# Patient Record
Sex: Male | Born: 1990 | Race: Black or African American | Hispanic: No | Marital: Single | Smoking: Former smoker
Health system: Southern US, Community
[De-identification: ages and names within clinical notes are randomized; demographics above are authoritative.]

## PROBLEM LIST (undated history)

## (undated) DIAGNOSIS — F419 Anxiety disorder, unspecified: Secondary | ICD-10-CM

## (undated) DIAGNOSIS — K219 Gastro-esophageal reflux disease without esophagitis: Secondary | ICD-10-CM

## (undated) DIAGNOSIS — J45909 Unspecified asthma, uncomplicated: Secondary | ICD-10-CM

## (undated) HISTORY — PX: TONSILLECTOMY: SUR1361

---

## 2014-02-09 ENCOUNTER — Emergency Department (HOSPITAL_COMMUNITY)
Admission: EM | Admit: 2014-02-09 | Discharge: 2014-02-10 | Disposition: A | Discharge status: Home / Self Care | Payer: No Typology Code available for payment source | Attending: Emergency Medicine | Admitting: Emergency Medicine

## 2014-02-09 ENCOUNTER — Encounter (HOSPITAL_COMMUNITY): Payer: Self-pay | Admitting: Emergency Medicine

## 2014-02-09 DIAGNOSIS — R002 Palpitations: Secondary | ICD-10-CM | POA: Insufficient documentation

## 2014-02-09 DIAGNOSIS — F17200 Nicotine dependence, unspecified, uncomplicated: Secondary | ICD-10-CM | POA: Insufficient documentation

## 2014-02-09 DIAGNOSIS — Z79899 Other long term (current) drug therapy: Secondary | ICD-10-CM | POA: Insufficient documentation

## 2014-02-09 DIAGNOSIS — J45909 Unspecified asthma, uncomplicated: Secondary | ICD-10-CM | POA: Insufficient documentation

## 2014-02-09 HISTORY — DX: Unspecified asthma, uncomplicated: J45.909

## 2014-02-09 LAB — CBC
HEMATOCRIT: 45.7 % (ref 39.0–52.0)
Hemoglobin: 15.9 g/dL (ref 13.0–17.0)
MCH: 32.4 pg (ref 26.0–34.0)
MCHC: 34.8 g/dL (ref 30.0–36.0)
MCV: 93.3 fL (ref 78.0–100.0)
Platelets: 242 10*3/uL (ref 150–400)
RBC: 4.9 MIL/uL (ref 4.22–5.81)
RDW: 11.7 % (ref 11.5–15.5)
WBC: 5.8 10*3/uL (ref 4.0–10.5)

## 2014-02-09 LAB — BASIC METABOLIC PANEL
BUN: 11 mg/dL (ref 6–23)
CALCIUM: 9.9 mg/dL (ref 8.4–10.5)
CO2: 21 mEq/L (ref 19–32)
Chloride: 98 mEq/L (ref 96–112)
Creatinine, Ser: 0.94 mg/dL (ref 0.50–1.35)
GFR calc Af Amer: >90 mL/min (ref >90)
GLUCOSE: 126 mg/dL — AB (ref 70–99)
Potassium: 3.5 mEq/L — ABNORMAL LOW (ref 3.7–5.3)
Sodium: 136 mEq/L — ABNORMAL LOW (ref 137–147)

## 2014-02-09 LAB — I-STAT TROPONIN, ED: Troponin i, poc: 0.01 ng/mL (ref 0.00–0.08)

## 2014-02-09 LAB — PRO B NATRIURETIC PEPTIDE: Pro B Natriuretic peptide (BNP): 11.6 pg/mL (ref 0–125)

## 2014-02-09 NOTE — ED Notes (Signed)
Pt states he has had palpitations in the past but he has been having them since yesterday and has felt weak and sob. Was seen by EMS yesterday but his vitals were WNL so he waited to come in. Alert and oriented.

## 2014-02-09 NOTE — ED Notes (Signed)
Pt states he has a warm sensation in his arms, chest, and legs.

## 2014-02-10 MED ORDER — POTASSIUM CHLORIDE CRYS ER 20 MEQ PO TBCR
40.0000 meq | EXTENDED_RELEASE_TABLET | Freq: Once | ORAL | Status: AC
  Administered 2014-02-10: 40 meq via ORAL
  Filled 2014-02-10: qty 2

## 2014-02-10 NOTE — ED Provider Notes (Signed)
CSN: [id number]     Arrival date & time 02/09/14  2219 History   First MD Initiated Contact with Patient 02/10/14 [phone]     Chief Complaint  Patient presents with  . Palpitations     (Consider location/radiation/quality/duration/timing/severity/associated sxs/prior Treatment) HPI HX per PT - weakness all over and palpitations, with warm sensation in his chest. Onset 2 days ago, had associated lip tingling, called EMS and was evaluated and declined transport for improving symptoms. At home tonight laying down developed same symptoms and presents here for evaluation.  No N/V/D. No F/C. Tried using his inhaler and also took some benadryl. Feels better now in the ER. No drug use. No medications, takes vitamins OTC. No syncope, no ABD pain. No increased exercise or dehydration. Drinks caffeine, no h/o thyroid disease.   Past Medical History  Diagnosis Date  . Asthma    Past Surgical History  Procedure Laterality Date  . Tonsillectomy     No family history on file. History  Substance Use Topics  . Smoking status: Current Some Day Smoker  . Smokeless tobacco: Not on file  . Alcohol Use: 3.6 oz/week    6 Cans of beer per week    Review of Systems  Constitutional: Negative for fever and chills.  Eyes: Negative for visual disturbance.  Respiratory: Negative for shortness of breath.   Cardiovascular: Positive for palpitations.  Gastrointestinal: Negative for vomiting and abdominal pain.  Genitourinary: Negative for dysuria.  Musculoskeletal: Negative for back pain and neck pain.  Skin: Negative for rash.  Neurological: Negative for headaches.  All other systems reviewed and are negative.      Allergies  Review of patient's allergies indicates no known allergies.  Home Medications   Current Outpatient Rx  Name  Route  Sig  Dispense  Refill  . albuterol (PROVENTIL HFA;VENTOLIN HFA) 108 (90 BASE) MCG/ACT inhaler   Inhalation   Inhale 1-2 puffs into the lungs every 6 (six)  hours as needed for wheezing or shortness of breath (wheezing).         . vitamin B-12 (CYANOCOBALAMIN) 100 MCG tablet   Oral   Take 100 mcg by mouth daily.         . vitamin E 400 UNIT capsule   Oral   Take 400 Units by mouth daily.          BP 135/78  Pulse 102  Temp(Src) 98.3 F (36.8 C) (Oral)  Resp 16  SpO2 99% Physical Exam  Constitutional: He is oriented to person, place, and time. He appears well-developed and well-nourished.  HENT:  Head: Normocephalic and atraumatic.  Mouth/Throat: No oropharyngeal exudate.  Mildly dry mucous membranes  Eyes: EOM are normal. Pupils are equal, round, and reactive to light.  Neck: Neck supple. No thyromegaly present.  Cardiovascular: Normal rate, regular rhythm and intact distal pulses.   Pulmonary/Chest: Effort normal and breath sounds normal. No respiratory distress. He exhibits no tenderness.  Abdominal: Soft. Bowel sounds are normal. He exhibits no distension. There is no tenderness. There is no rebound.  Musculoskeletal: Normal range of motion. He exhibits no edema.  Neurological: He is alert and oriented to person, place, and time.  Skin: Skin is warm and dry.    ED Course  Procedures (including critical care time) Labs Review Labs Reviewed  BASIC METABOLIC PANEL - Abnormal; Notable for the following:    Sodium 136 (*)    Potassium 3.5 (*)    Glucose, Bld 126 (*)    All  other components within normal limits  CBC  PRO B NATRIURETIC PEPTIDE  I-STAT TROPOININ, ED   Imaging Review No results found.   EKG Interpretation   Date/Time:  Monday February 09 2014 22:47:58 EDT Ventricular Rate:  104 PR Interval:  175 QRS Duration: 85 QT Interval:  316 QTC Calculation: 416 R Axis:   66 Text Interpretation:  Sinus tachycardia Nonspecific ST abnormality No old  tracing to compare Confirmed by Katielynn Horan  MD, Joah Patlan ([id number]) on 02/10/2014  1:44:51 AM     PO fluids and potassium provided.  2:52 AM patient is feeling much  better his heart rate is normalized.   Plan discharge home followup with primary care physician, have thyroid checked, and consider Holter monitoring. Patient agrees to strict return precautions. He will try to increase potassium in his diet.    MDM   Diagnosis: Palpitations  Has had 2 recurrent episodes of palpitations with perioral tingling. No history of panic attacks otherwise. Symptoms did occur in conjunction with albuterol use, although has been using albuterol for years. Labs obtained and reviewed as above. Potassium provided. EKG reviewed sinus tachycardia. Patient does appear mildly dehydrated on exam and oral fluids provided. No indication for admit for further workup in the ER at this time. Vital signs nursing notes reviewed and considered   Sunnie Nielsen, MD 02/10/14 [phone]

## 2014-02-10 NOTE — Discharge Instructions (Signed)
Palpitations   A palpitation is the feeling that your heartbeat is irregular or is faster than normal. It may feel like your heart is fluttering or skipping a beat. Palpitations are usually not a serious problem. However, in some cases, you may need further medical evaluation.  CAUSES   Palpitations can be caused by:   Smoking.   Caffeine or other stimulants, such as diet pills or energy drinks.   Alcohol.   Stress and anxiety.   Strenuous physical activity.   Fatigue.   Certain medicines.   Heart disease, especially if you have a history of arrhythmias. This includes atrial fibrillation, atrial flutter, or supraventricular tachycardia.   An improperly working pacemaker or defibrillator.  DIAGNOSIS   To find the cause of your palpitations, your caregiver will take your history and perform a physical exam. Tests may also be done, including:   Electrocardiography (ECG). This test records the heart's electrical activity.   Cardiac monitoring. This allows your caregiver to monitor your heart rate and rhythm in real time.   Holter monitor. This is a portable device that records your heartbeat and can help diagnose heart arrhythmias. It allows your caregiver to track your heart activity for several days, if needed.   Stress tests by exercise or by giving medicine that makes the heart beat faster.  TREATMENT   Treatment of palpitations depends on the cause of your symptoms and can vary greatly. Most cases of palpitations do not require any treatment other than time, relaxation, and monitoring your symptoms. Other causes, such as atrial fibrillation, atrial flutter, or supraventricular tachycardia, usually require further treatment.  HOME CARE INSTRUCTIONS    Avoid:   Caffeinated coffee, tea, soft drinks, diet pills, and energy drinks.   Chocolate.   Alcohol.   Stop smoking if you smoke.   Reduce your stress and anxiety. Things that can help you relax include:   A method that measures bodily functions so  you can learn to control them (biofeedback).   Yoga.   Meditation.   Physical activity such as swimming, jogging, or walking.   Get plenty of rest and sleep.  SEEK MEDICAL CARE IF:    You continue to have a fast or irregular heartbeat beyond 24 hours.   Your palpitations occur more often.  SEEK IMMEDIATE MEDICAL CARE IF:   You develop chest pain or shortness of breath.   You have a severe headache.   You feel dizzy, or you faint.  MAKE SURE YOU:   Understand these instructions.   Will watch your condition.   Will get help right away if you are not doing well or get worse.  Document Released: 10/20/2000 Document Revised: 02/17/2013 Document Reviewed: 12/22/2011  [employer] Patient Information 2014 [employer].

## 2014-02-13 ENCOUNTER — Encounter (HOSPITAL_COMMUNITY): Payer: Self-pay | Admitting: Emergency Medicine

## 2014-02-13 ENCOUNTER — Emergency Department (HOSPITAL_COMMUNITY)
Admission: EM | Admit: 2014-02-13 | Discharge: 2014-02-13 | Disposition: A | Discharge status: Home / Self Care | Payer: No Typology Code available for payment source | Attending: Emergency Medicine | Admitting: Emergency Medicine

## 2014-02-13 DIAGNOSIS — F17200 Nicotine dependence, unspecified, uncomplicated: Secondary | ICD-10-CM | POA: Insufficient documentation

## 2014-02-13 DIAGNOSIS — J45909 Unspecified asthma, uncomplicated: Secondary | ICD-10-CM | POA: Insufficient documentation

## 2014-02-13 DIAGNOSIS — R531 Weakness: Secondary | ICD-10-CM

## 2014-02-13 DIAGNOSIS — B349 Viral infection, unspecified: Secondary | ICD-10-CM

## 2014-02-13 DIAGNOSIS — E876 Hypokalemia: Secondary | ICD-10-CM | POA: Insufficient documentation

## 2014-02-13 DIAGNOSIS — R5383 Other fatigue: Secondary | ICD-10-CM | POA: Insufficient documentation

## 2014-02-13 DIAGNOSIS — R5381 Other malaise: Secondary | ICD-10-CM | POA: Insufficient documentation

## 2014-02-13 DIAGNOSIS — Z79899 Other long term (current) drug therapy: Secondary | ICD-10-CM | POA: Insufficient documentation

## 2014-02-13 LAB — I-STAT CHEM 8, ED
BUN: 7 mg/dL (ref 6–23)
CHLORIDE: 100 meq/L (ref 96–112)
Calcium, Ion: 1.2 mmol/L (ref 1.12–1.23)
Creatinine, Ser: 1 mg/dL (ref 0.50–1.35)
GLUCOSE: 101 mg/dL — AB (ref 70–99)
HCT: 51 % (ref 39.0–52.0)
Hemoglobin: 17.3 g/dL — ABNORMAL HIGH (ref 13.0–17.0)
POTASSIUM: 3.8 meq/L (ref 3.7–5.3)
Sodium: 140 mEq/L (ref 137–147)
TCO2: 24 mmol/L (ref 0–100)

## 2014-02-13 NOTE — ED Notes (Signed)
Pt reports was seen on Monday for same sx, diagnosis of hypokalemia. Pt was given PO potassium while in ED. Pt states since Monday has been multi-vitamins with 80 mg Potassium. Pt c/o continued weakness, SOB, and shaking.

## 2014-02-13 NOTE — ED Provider Notes (Addendum)
CSN: [id number]     Arrival date & time 02/13/14  0153 History   First MD Initiated Contact with Patient 02/13/14 0210     Chief Complaint  Patient presents with  . hypokalemia      (Consider location/radiation/quality/duration/timing/severity/associated sxs/prior Treatment) HPI Comments: Pt comes in with cc of weakness. Pt has asthma hx. Started having some chest discomfort and dib on Sunday. Took breathing tx, with no improvement, so came to the ER. Workup was neg, and he was discharged. He reports that he still feels generalized weakness. Pt denies nausea, emesis, fevers, chills, chest pains, shortness of breath, headaches, abdominal pain, uti like symptoms at this time. No hx of PE, DVT for the patient, and no risk factors for the same. No substance abuse hx.    The history is provided by the patient.    Past Medical History  Diagnosis Date  . Asthma    Past Surgical History  Procedure Laterality Date  . Tonsillectomy     No family history on file. History  Substance Use Topics  . Smoking status: Current Some Day Smoker  . Smokeless tobacco: Not on file  . Alcohol Use: 3.6 oz/week    6 Cans of beer per week    Review of Systems  Constitutional: Positive for fatigue. Negative for chills, activity change and appetite change.  Respiratory: Negative for cough and shortness of breath.   Cardiovascular: Negative for chest pain.  Gastrointestinal: Negative for abdominal pain.  Genitourinary: Negative for dysuria.  Neurological: Positive for weakness.      Allergies  Review of patient's allergies indicates no known allergies.  Home Medications   Current Outpatient Rx  Name  Route  Sig  Dispense  Refill  . albuterol (PROVENTIL HFA;VENTOLIN HFA) 108 (90 BASE) MCG/ACT inhaler   Inhalation   Inhale 1-2 puffs into the lungs every 6 (six) hours as needed for wheezing or shortness of breath (wheezing).         . Multiple Vitamin (MULTIVITAMIN WITH MINERALS) TABS  tablet   Oral   Take 1-2 tablets by mouth daily.          . vitamin B-12 (CYANOCOBALAMIN) 100 MCG tablet   Oral   Take 100 mcg by mouth daily.         . vitamin E 400 UNIT capsule   Oral   Take 400 Units by mouth daily.          BP 136/75  Pulse 98  Temp(Src) 98.3 F (36.8 C) (Oral)  Resp 16  Ht 6\' 3"  (1.905 m)  Wt 267 lb (121.11 kg)  BMI 33.37 kg/m2  SpO2 97% Physical Exam  Nursing note and vitals reviewed. Constitutional: He is oriented to person, place, and time. He appears well-developed.  HENT:  Head: Normocephalic and atraumatic.  Eyes: Conjunctivae and EOM are normal. Pupils are equal, round, and reactive to light.  Neck: Normal range of motion. Neck supple.  Cardiovascular: Normal rate and regular rhythm.   Pulmonary/Chest: Effort normal and breath sounds normal.  Abdominal: Soft. Bowel sounds are normal. He exhibits no distension. There is no tenderness. There is no rebound and no guarding.  Neurological: He is alert and oriented to person, place, and time.  Skin: Skin is warm.    ED Course  Procedures (including critical care time) Labs Review Labs Reviewed  I-STAT CHEM 8, ED   Imaging Review No results found.   EKG Interpretation   Date/Time:  Friday February 13 2014 02:17:58 EDT  Ventricular Rate:  94 PR Interval:  175 QRS Duration: 84 QT Interval:  317 QTC Calculation: 396 R Axis:   66 Text Interpretation:  Sinus rhythm Probable left atrial enlargement ST  elev, probable normal early repol pattern Confirmed by Amisadai Woodford, MD, Janey Genta  ([id number]) on 02/13/2014 4:43:17 AM      MDM   Final diagnoses:  None    Pt comes in with cc of generalized weakness. His ROS is otherwise negative. Some dib, chest discomfort earlier in the weak. Pt has been hydrating well - and yes feels weak. No hx of thyroid disorder. His personal and family hx are also unremarkable.  i dont thin there is anything emergent. Will check chem 8, ekg. Suspect some  underlying mild viral infection with no overt manifestation or metabolic process.  Derwood Kaplan, MD 02/13/14 [id number]  Derwood Kaplan, MD 02/13/14 [phone]

## 2014-02-13 NOTE — Discharge Instructions (Signed)
Weakness  Weakness is a lack of strength. It may be felt all over the body (generalized) or in one specific part of the body (focal). Some causes of weakness can be serious. You may need further medical evaluation, especially if you are elderly or you have a history of immunosuppression (such as chemotherapy or HIV), kidney disease, heart disease, or diabetes.  CAUSES   Weakness can be caused by many different things, including:  · Infection.  · Physical exhaustion.  · Internal bleeding or other blood loss that results in a lack of red blood cells (anemia).  · Dehydration. This cause is more common in elderly people.  · Side effects or electrolyte abnormalities from medicines, such as pain medicines or sedatives.  · Emotional distress, anxiety, or depression.  · Circulation problems, especially severe peripheral arterial disease.  · Heart disease, such as rapid atrial fibrillation, bradycardia, or heart failure.  · Nervous system disorders, such as Guillain-Barré syndrome, multiple sclerosis, or stroke.  DIAGNOSIS   To find the cause of your weakness, your caregiver will take your history and perform a physical exam. Lab tests or X-rays may also be ordered, if needed.  TREATMENT   Treatment of weakness depends on the cause of your symptoms and can vary greatly.  HOME CARE INSTRUCTIONS   · Rest as needed.  · Eat a well-balanced diet.  · Try to get some exercise every day.  · Only take over-the-counter or prescription medicines as directed by your caregiver.  SEEK MEDICAL CARE IF:   · Your weakness seems to be getting worse or spreads to other parts of your body.  · You develop new aches or pains.  SEEK IMMEDIATE MEDICAL CARE IF:   · You cannot perform your normal daily activities, such as getting dressed and feeding yourself.  · You cannot walk up and down stairs, or you feel exhausted when you do so.  · You have shortness of breath or chest pain.  · You have difficulty moving parts of your body.  · You have weakness  in only one area of the body or on only one side of the body.  · You have a fever.  · You have trouble speaking or swallowing.  · You cannot control your bladder or bowel movements.  · You have black or bloody vomit or stools.  MAKE SURE YOU:  · Understand these instructions.  · Will watch your condition.  · Will get help right away if you are not doing well or get worse.  Document Released: 10/23/2005 Document Revised: 04/23/2012 Document Reviewed: 12/22/2011  [employer]® Patient Information ©2014 [employer].

## 2014-02-13 NOTE — ED Notes (Signed)
EKG given to EDP,Nanavati,MD. For review. 

## 2014-11-30 ENCOUNTER — Emergency Department (HOSPITAL_COMMUNITY): Payer: No Typology Code available for payment source

## 2014-11-30 ENCOUNTER — Encounter (HOSPITAL_COMMUNITY): Payer: Self-pay | Admitting: Emergency Medicine

## 2014-11-30 ENCOUNTER — Emergency Department (HOSPITAL_COMMUNITY)
Admission: EM | Admit: 2014-11-30 | Discharge: 2014-11-30 | Disposition: A | Discharge status: Home / Self Care | Payer: No Typology Code available for payment source | Attending: Emergency Medicine | Admitting: Emergency Medicine

## 2014-11-30 DIAGNOSIS — Z79899 Other long term (current) drug therapy: Secondary | ICD-10-CM | POA: Insufficient documentation

## 2014-11-30 DIAGNOSIS — W000XXA Fall on same level due to ice and snow, initial encounter: Secondary | ICD-10-CM | POA: Diagnosis not present

## 2014-11-30 DIAGNOSIS — Y9301 Activity, walking, marching and hiking: Secondary | ICD-10-CM | POA: Diagnosis not present

## 2014-11-30 DIAGNOSIS — S29092A Other injury of muscle and tendon of back wall of thorax, initial encounter: Secondary | ICD-10-CM | POA: Diagnosis not present

## 2014-11-30 DIAGNOSIS — Y998 Other external cause status: Secondary | ICD-10-CM | POA: Diagnosis not present

## 2014-11-30 DIAGNOSIS — Y9289 Other specified places as the place of occurrence of the external cause: Secondary | ICD-10-CM | POA: Diagnosis not present

## 2014-11-30 DIAGNOSIS — J45909 Unspecified asthma, uncomplicated: Secondary | ICD-10-CM | POA: Insufficient documentation

## 2014-11-30 DIAGNOSIS — M546 Pain in thoracic spine: Secondary | ICD-10-CM

## 2014-11-30 DIAGNOSIS — W19XXXA Unspecified fall, initial encounter: Secondary | ICD-10-CM

## 2014-11-30 DIAGNOSIS — Z720 Tobacco use: Secondary | ICD-10-CM | POA: Diagnosis not present

## 2014-11-30 MED ORDER — TRAMADOL HCL 50 MG PO TABS
50.0000 mg | ORAL_TABLET | Freq: Four times a day (QID) | ORAL | Status: DC | PRN
Start: 2014-11-30 — End: 2016-05-07

## 2014-11-30 NOTE — ED Notes (Addendum)
Pt had fall this morning on ice, c/o upper left back pain. No deformity noted.

## 2014-11-30 NOTE — Discharge Instructions (Signed)
Back Pain, Adult Low back pain is very common. About 1 in 5 people have back pain.The cause of low back pain is rarely dangerous. The pain often gets better over time.About half of people with a sudden onset of back pain feel better in just 2 weeks. About 8 in 10 people feel better by 6 weeks.  CAUSES Some common causes of back pain include:  Strain of the muscles or ligaments supporting the spine.  Wear and tear (degeneration) of the spinal discs.  Arthritis.  Direct injury to the back. DIAGNOSIS Most of the time, the direct cause of low back pain is not known.However, back pain can be treated effectively even when the exact cause of the pain is unknown.Answering your caregiver's questions about your overall health and symptoms is one of the most accurate ways to make sure the cause of your pain is not dangerous. If your caregiver needs more information, he or she may order lab work or imaging tests (X-rays or MRIs).However, even if imaging tests show changes in your back, this usually does not require surgery. HOME CARE INSTRUCTIONS For many people, back pain returns.Since low back pain is rarely dangerous, it is often a condition that people can learn to manageon their own.   Remain active. It is stressful on the back to sit or stand in one place. Do not sit, drive, or stand in one place for more than 30 minutes at a time. Take short walks on level surfaces as soon as pain allows.Try to increase the length of time you walk each day.  Do not stay in bed.Resting more than 1 or 2 days can delay your recovery.  Do not avoid exercise or work.Your body is made to move.It is not dangerous to be active, even though your back may hurt.Your back will likely heal faster if you return to being active before your pain is gone.  Pay attention to your body when you bend and lift. Many people have less discomfortwhen lifting if they bend their knees, keep the load close to their bodies,and  avoid twisting. Often, the most comfortable positions are those that put less stress on your recovering back.  Find a comfortable position to sleep. Use a firm mattress and lie on your side with your knees slightly bent. If you lie on your back, put a pillow under your knees.  Only take over-the-counter or prescription medicines as directed by your caregiver. Over-the-counter medicines to reduce pain and inflammation are often the most helpful.Your caregiver may prescribe muscle relaxant drugs.These medicines help dull your pain so you can more quickly return to your normal activities and healthy exercise.  Put ice on the injured area.  Put ice in a plastic bag.  Place a towel between your skin and the bag.  Leave the ice on for 15-20 minutes, 03-04 times a day for the first 2 to 3 days. After that, ice and heat may be alternated to reduce pain and spasms.  Ask your caregiver about trying back exercises and gentle massage. This may be of some benefit.  Avoid feeling anxious or stressed.Stress increases muscle tension and can worsen back pain.It is important to recognize when you are anxious or stressed and learn ways to manage it.Exercise is a great option. SEEK MEDICAL CARE IF:  You have pain that is not relieved with rest or medicine.  You have pain that does not improve in 1 week.  You have new symptoms.  You are generally not feeling well. SEEK   IMMEDIATE MEDICAL CARE IF:   You have pain that radiates from your back into your legs.  You develop new bowel or bladder control problems.  You have unusual weakness or numbness in your arms or legs.  You develop nausea or vomiting.  You develop abdominal pain.  You feel faint. Document Released: 10/23/2005 Document Revised: 04/23/2012 Document Reviewed: 02/24/2014 [employer] Patient Information 2015 [employer]. This information is not intended to replace advice given to you by your health care provider. Make sure you  discuss any questions you have with your health care provider.  

## 2014-11-30 NOTE — ED Provider Notes (Signed)
CSN: [id number]     Arrival date & time 11/30/14  0809 History   First MD Initiated Contact with Patient 11/30/14 [phone]     Chief Complaint  Patient presents with  . Back Pain  . Fall     (Consider location/radiation/quality/duration/timing/severity/associated sxs/prior Treatment) HPI  PCP: No PCP Per Patient Blood pressure 146/117, pulse 107, temperature 98.5 F (36.9 C), temperature source Oral, resp. rate 20, SpO2 96 %.  Gerald Cook is a 24 y.o.male without any significant PMH presents to the ER with complaints of mid back pain after two falls back to back on the ice. He was walking out to his car and lost his footing falling on his mid upper back. He attempted to stand up at which point he fell backwards again landing on his back. He reports hearing a crack. He has been up and ambulating since then. The incident happened just prior to arrival. He denies having loc or neck pain. He has not had bowel or urine incontinence. On his drive to work he decided to come to the ER instead due to the cracking noise during the fall and some of the discomfort that he has. NO pain at rest but pain with ROM.  Past Medical History  Diagnosis Date  . Asthma    Past Surgical History  Procedure Laterality Date  . Tonsillectomy     No family history on file. History  Substance Use Topics  . Smoking status: Current Some Day Smoker  . Smokeless tobacco: Not on file  . Alcohol Use: 3.6 oz/week    6 Cans of beer per week    Review of Systems  10 Systems reviewed and are negative for acute change except as noted in the HPI.    Allergies  Review of patient's allergies indicates no known allergies.  Home Medications   Prior to Admission medications   Medication Sig Start Date End Date Taking? Authorizing Provider  albuterol (PROVENTIL HFA;VENTOLIN HFA) 108 (90 BASE) MCG/ACT inhaler Inhale 1-2 puffs into the lungs every 6 (six) hours as needed for wheezing or shortness of breath.    Yes  Historical Provider, MD  loratadine (CLARITIN) 10 MG tablet Take 10 mg by mouth daily.   Yes Historical Provider, MD  Multiple Vitamin (MULTIVITAMIN WITH MINERALS) TABS tablet Take 1 tablet by mouth daily. If doesn't take tablet will drink smoothie   Yes Historical Provider, MD   BP 146/117 mmHg  Pulse 107  Temp(Src) 98.5 F (36.9 C) (Oral)  Resp 20  SpO2 96% Physical Exam  Constitutional: He appears well-developed and well-nourished. No distress.  HENT:  Head: Normocephalic and atraumatic.  Eyes: Pupils are equal, round, and reactive to light.  Neck: Normal range of motion. Neck supple.  Cardiovascular: Normal rate and regular rhythm.   Pulmonary/Chest: Effort normal.  Abdominal: Soft.  Musculoskeletal:  Pt has equal strength to bilateral lower extremities.  Neurosensory function adequate to both legs Skin color is normal. Skin is warm and moist.  I see no step off deformity, no midline bony tenderness.  Pt is able to ambulate.  No crepitus, laceration, effusion, induration, lesions, swelling.   Pedal pulses are symmetrical and palpable bilaterally  No tenderness to palpation MIDLINE or to THORACIC paraspinel muscles   Neurological: He is alert.  Cranial nerves II-VIII and X-XII evaluated and show no deficits. Pt alert and oriented x 3 Upper and lower extremity strength is symmetrical and physiologic Normal muscular tone No facial droop Coordination intact,  Skin: Skin  is warm and dry.  Nursing note and vitals reviewed.   ED Course  Procedures (including critical care time) Labs Review Labs Reviewed - No data to display  Imaging Review Dg Thoracic Spine 2 View  11/30/2014   CLINICAL DATA:  Larey SeatFell ice at home today. Back pain. Next evaluation .  EXAM: THORACIC SPINE - 2 VIEW  COMPARISON:  None.  FINDINGS: Paraspinal soft tissues are unremarkable. No acute bony abnormality identified. Pedicles are intact.  IMPRESSION: No acute abnormality.   Electronically Signed   By:  Maisie Fushomas  Register   On: 11/30/2014 09:16     EKG Interpretation None      MDM   Final diagnoses:  Fall  Midline thoracic back pain   patient has a normal x-ray and nonacute physical exam findings. He is given reassurance and advised to rest, stretch, ice and take ibuprofen for his symptoms.  24 y.o.Gerald Cook's  with back pain. No neurological deficits and normal neuro exam. Patient can walk. No loss of bowel or bladder control. No concern for cauda equina at this time base on HPI and physical exam findings. No fever, night sweats, weight loss, h/o cancer, IVDU.   RICE protocol and pain medicine indicated and discussed with patient.   Patient Plan 1. Medications: pain medication and muscle relaxer. Cont usual home medications unless otherwise directed. 2. Treatment: rest, drink plenty of fluids, gentle stretching as discussed, alternate ice and heat  3. Follow Up: Please followup with your primary doctor for discussion of your diagnoses and further evaluation after today's visit; if you do not have a primary care doctor use the resource guide provided to find one  Advised to follow-up with the orthopedist if symptoms do not start to resolve in the next 2-3 days. If develop loss of bowel or urinary control return to the ED as soon as possible for further evaluation. To take the medications as prescribed as they can cause harm if not taken appropriately.   Vital signs are stable at discharge. Filed Vitals:   11/30/14 0816  BP: 146/117  Pulse: 107  Temp: 98.5 F (36.9 C)  Resp: 20    Patient/guardian has voiced understanding and agreed to follow-up with the PCP or specialist.         Dorthula Matasiffany G Draycen Leichter, PA-C 11/30/14 [phone]  Suzi RootsKevin E Steinl, MD 12/01/14 [phone]

## 2014-12-10 ENCOUNTER — Telehealth: Payer: Self-pay | Admitting: *Deleted

## 2014-12-10 NOTE — Telephone Encounter (Signed)
Pt could not find prescription written 1/25.Marland Kitchen.Marland Kitchen.Pharmacy talked with MD for verbal order.

## 2015-11-15 ENCOUNTER — Emergency Department (HOSPITAL_BASED_OUTPATIENT_CLINIC_OR_DEPARTMENT_OTHER)
Admission: EM | Admit: 2015-11-15 | Discharge: 2015-11-15 | Disposition: A | Discharge status: Home / Self Care | Payer: 59 | Attending: Emergency Medicine | Admitting: Emergency Medicine

## 2015-11-15 ENCOUNTER — Encounter (HOSPITAL_BASED_OUTPATIENT_CLINIC_OR_DEPARTMENT_OTHER): Payer: Self-pay | Admitting: *Deleted

## 2015-11-15 ENCOUNTER — Other Ambulatory Visit: Payer: Self-pay

## 2015-11-15 DIAGNOSIS — F17200 Nicotine dependence, unspecified, uncomplicated: Secondary | ICD-10-CM | POA: Diagnosis not present

## 2015-11-15 DIAGNOSIS — R202 Paresthesia of skin: Secondary | ICD-10-CM | POA: Diagnosis present

## 2015-11-15 DIAGNOSIS — Z79899 Other long term (current) drug therapy: Secondary | ICD-10-CM | POA: Diagnosis not present

## 2015-11-15 DIAGNOSIS — R002 Palpitations: Secondary | ICD-10-CM | POA: Insufficient documentation

## 2015-11-15 DIAGNOSIS — J45909 Unspecified asthma, uncomplicated: Secondary | ICD-10-CM | POA: Insufficient documentation

## 2015-11-15 LAB — CBC
HCT: 45.4 % (ref 39.0–52.0)
Hemoglobin: 15.2 g/dL (ref 13.0–17.0)
MCH: 30.9 pg (ref 26.0–34.0)
MCHC: 33.5 g/dL (ref 30.0–36.0)
MCV: 92.3 fL (ref 78.0–100.0)
PLATELETS: 272 10*3/uL (ref 150–400)
RBC: 4.92 MIL/uL (ref 4.22–5.81)
RDW: 11.5 % (ref 11.5–15.5)
WBC: 4.4 10*3/uL (ref 4.0–10.5)

## 2015-11-15 LAB — BASIC METABOLIC PANEL
Anion gap: 9 (ref 5–15)
BUN: 11 mg/dL (ref 6–20)
CHLORIDE: 105 mmol/L (ref 101–111)
CO2: 24 mmol/L (ref 22–32)
Calcium: 9.6 mg/dL (ref 8.9–10.3)
Creatinine, Ser: 1.06 mg/dL (ref 0.61–1.24)
GFR calc Af Amer: >60 mL/min (ref >60)
GFR calc non Af Amer: >60 mL/min (ref >60)
GLUCOSE: 135 mg/dL — AB (ref 65–99)
POTASSIUM: 4.1 mmol/L (ref 3.5–5.1)
Sodium: 138 mmol/L (ref 135–145)

## 2015-11-15 NOTE — Discharge Instructions (Signed)
Palpitations °A palpitation is the feeling that your heartbeat is irregular or is faster than normal. It may feel like your heart is fluttering or skipping a beat. Palpitations are usually not a serious problem. However, in some cases, you may need further medical evaluation. °CAUSES  °Palpitations can be caused by: °· Smoking. °· Caffeine or other stimulants, such as diet pills or energy drinks. °· Alcohol. °· Stress and anxiety. °· Strenuous physical activity. °· Fatigue. °· Certain medicines. °· Heart disease, especially if you have a history of irregular heart rhythms (arrhythmias), such as atrial fibrillation, atrial flutter, or supraventricular tachycardia. °· An improperly working pacemaker or defibrillator. °DIAGNOSIS  °To find the cause of your palpitations, your health care provider will take your medical history and perform a physical exam. Your health care provider may also have you take a test called an ambulatory electrocardiogram (ECG). An ECG records your heartbeat patterns over a 24-hour period. You may also have other tests, such as: °· Transthoracic echocardiogram (TTE). During echocardiography, sound waves are used to evaluate how blood flows through your heart. °· Transesophageal echocardiogram (TEE). °· Cardiac monitoring. This allows your health care provider to monitor your heart rate and rhythm in real time. °· Holter monitor. This is a portable device that records your heartbeat and can help diagnose heart arrhythmias. It allows your health care provider to track your heart activity for several days, if needed. °· Stress tests by exercise or by giving medicine that makes the heart beat faster. °TREATMENT  °Treatment of palpitations depends on the cause of your symptoms and can vary greatly. Most cases of palpitations do not require any treatment other than time, relaxation, and monitoring your symptoms. Other causes, such as atrial fibrillation, atrial flutter, or supraventricular  tachycardia, usually require further treatment. °HOME CARE INSTRUCTIONS  °· Avoid: °· Caffeinated coffee, tea, soft drinks, diet pills, and energy drinks. °· Chocolate. °· Alcohol. °· Stop smoking if you smoke. °· Reduce your stress and anxiety. Things that can help you relax include: °· A method of controlling things in your body, such as your heartbeats, with your mind (biofeedback). °· Yoga. °· Meditation. °· Physical activity such as swimming, jogging, or walking. °· Get plenty of rest and sleep. °SEEK MEDICAL CARE IF:  °· You continue to have a fast or irregular heartbeat beyond 24 hours. °· Your palpitations occur more often. °SEEK IMMEDIATE MEDICAL CARE IF: °· You have chest pain or shortness of breath. °· You have a severe headache. °· You feel dizzy or you faint. °MAKE SURE YOU: °· Understand these instructions. °· Will watch your condition. °· Will get help right away if you are not doing well or get worse. °  °This information is not intended to replace advice given to you by your health care provider. Make sure you discuss any questions you have with your health care provider. °  °Document Released: 10/20/2000 Document Revised: 10/28/2013 Document Reviewed: 12/22/2011 °Elsevier Interactive Patient Education ©2016 [employer] ° °Panic Attacks °Panic attacks are sudden, short-lived surges of severe anxiety, fear, or discomfort. They may occur for no reason when you are relaxed, when you are anxious, or when you are sleeping. Panic attacks may occur for a number of reasons:  °· Healthy people occasionally have panic attacks in extreme, life-threatening situations, such as war or natural disasters. Normal anxiety is a protective mechanism of the body that helps us react to danger (fight or flight response). °· Panic attacks are often seen with anxiety disorders, such   as panic disorder, social anxiety disorder, generalized anxiety disorder, and phobias. Anxiety disorders cause excessive or uncontrollable  anxiety. They may interfere with your relationships or other life activities. °· Panic attacks are sometimes seen with other mental illnesses, such as depression and posttraumatic stress disorder. °· Certain medical conditions, prescription medicines, and drugs of abuse can cause panic attacks. °SYMPTOMS  °Panic attacks start suddenly, peak within 20 minutes, and are accompanied by four or more of the following symptoms: °· Pounding heart or fast heart rate (palpitations). °· Sweating. °· Trembling or shaking. °· Shortness of breath or feeling smothered. °· Feeling choked. °· Chest pain or discomfort. °· Nausea or strange feeling in your stomach. °· Dizziness, light-headedness, or feeling like you will faint. °· Chills or hot flushes. °· Numbness or tingling in your lips or hands and feet. °· Feeling that things are not real or feeling that you are not yourself. °· Fear of losing control or going crazy. °· Fear of dying. °Some of these symptoms can mimic serious medical conditions. For example, you may think you are having a heart attack. Although panic attacks can be very scary, they are not life threatening. °DIAGNOSIS  °Panic attacks are diagnosed through an assessment by your health care provider. Your health care provider will ask questions about your symptoms, such as where and when they occurred. Your health care provider will also ask about your medical history and use of alcohol and drugs, including prescription medicines. Your health care provider may order blood tests or other studies to rule out a serious medical condition. Your health care provider may refer you to a mental health professional for further evaluation. °TREATMENT  °· Most healthy people who have one or two panic attacks in an extreme, life-threatening situation will not require treatment. °· The treatment for panic attacks associated with anxiety disorders or other mental illness typically involves counseling with a mental health  professional, medicine, or a combination of both. Your health care provider will help determine what treatment is best for you. °· Panic attacks due to physical illness usually go away with treatment of the illness. If prescription medicine is causing panic attacks, talk with your health care provider about stopping the medicine, decreasing the dose, or substituting another medicine. °· Panic attacks due to alcohol or drug abuse go away with abstinence. Some adults need professional help in order to stop drinking or using drugs. °HOME CARE INSTRUCTIONS  °· Take all medicines as directed by your health care provider.   °· Schedule and attend follow-up visits as directed by your health care provider. It is important to keep all your appointments. °SEEK MEDICAL CARE IF: °· You are not able to take your medicines as prescribed. °· Your symptoms do not improve or get worse. °SEEK IMMEDIATE MEDICAL CARE IF:  °· You experience panic attack symptoms that are different than your usual symptoms. °· You have serious thoughts about hurting yourself or others. °· You are taking medicine for panic attacks and have a serious side effect. °MAKE SURE YOU: °· Understand these instructions. °· Will watch your condition. °· Will get help right away if you are not doing well or get worse. °  °This information is not intended to replace advice given to you by your health care provider. Make sure you discuss any questions you have with your health care provider. °  °Document Released: 10/23/2005 Document Revised: 10/28/2013 Document Reviewed: 06/06/2013 °Elsevier Interactive Patient Education ©2016 [employer] ° °

## 2015-11-15 NOTE — ED Provider Notes (Signed)
CSN: [id number]     Arrival date & time 11/15/15  0954 History   First MD Initiated Contact with Patient 11/15/15 1004     Chief Complaint  Patient presents with  . hands tingling      HPI Patient presents to the emergency department with complaints of palpitations and just not feeling right.  2 days ago he had been drinking coffee and alcohol earlier in the day and then when he got into the shower he became lightheaded.  He sat on the ground at that time and began to feel short of breath than his heart began to race.  He called EMS and after 30 minutes of EMS evaluation he felt much better and was able to stay at home without seeking medical care at that time.  That time he stated he started feeling better when he found out that his pulse rate was normal.  He had a normal day yesterday.  Today he was driving to work and he began feeling "abnormal".  This began to worsen.  She developed tingling in his face as well as in his hands.  He drove himself to the ER for evaluation.  He denies palpitations at this time.  He states his tingling is improving.  Review the medical records demonstrates a similar visit in 2015 for palpitations and tingling of the face.  He has never seen a primary care physician or cardiologist regarding the symptoms.  He has no history of mental health disorder.  No prior history of panic attacks.   Past Medical History  Diagnosis Date  . Asthma    Past Surgical History  Procedure Laterality Date  . Tonsillectomy     History reviewed. No pertinent family history. Social History  Substance Use Topics  . Smoking status: Current Some Day Smoker  . Smokeless tobacco: None  . Alcohol Use: 3.6 oz/week    6 Cans of beer per week    Review of Systems  All other systems reviewed and are negative.     Allergies  Review of patient's allergies indicates no known allergies.  Home Medications   Prior to Admission medications   Medication Sig Start Date End Date Taking?  Authorizing Provider  albuterol (PROVENTIL HFA;VENTOLIN HFA) 108 (90 BASE) MCG/ACT inhaler Inhale 1-2 puffs into the lungs every 6 (six) hours as needed for wheezing or shortness of breath.     Historical Provider, MD  loratadine (CLARITIN) 10 MG tablet Take 10 mg by mouth daily.    Historical Provider, MD  Multiple Vitamin (MULTIVITAMIN WITH MINERALS) TABS tablet Take 1 tablet by mouth daily. If doesn't take tablet will drink smoothie    Historical Provider, MD  traMADol (ULTRAM) 50 MG tablet Take 1 tablet (50 mg total) by mouth every 6 (six) hours as needed. 11/30/14   Tiffany Neva Seat, PA-C   BP 142/90 mmHg  Pulse 99  Temp(Src) 98.4 F (36.9 C) (Oral)  Resp 18  SpO2 99% Physical Exam  Constitutional: He is oriented to person, place, and time. He appears well-developed and well-nourished.  HENT:  Head: Normocephalic and atraumatic.  Eyes: EOM are normal.  Neck: Normal range of motion.  Cardiovascular: Normal rate, regular rhythm, normal heart sounds and intact distal pulses.   Pulmonary/Chest: Effort normal and breath sounds normal. No respiratory distress.  Abdominal: Soft. He exhibits no distension. There is no tenderness.  Musculoskeletal: Normal range of motion.  Neurological: He is alert and oriented to person, place, and time.  Skin: Skin is  warm and dry.  Psychiatric: He has a normal mood and affect. Judgment normal.  Nursing note and vitals reviewed.   ED Course  Procedures (including critical care time) Labs Review Labs Reviewed  BASIC METABOLIC PANEL - Abnormal; Notable for the following:    Glucose, Bld 135 (*)    All other components within normal limits  CBC    Imaging Review No results found. I have personally reviewed and evaluated these images and lab results as part of my medical decision-making.  ECG interpretation   Date: 11/15/2015  Rate: 90  Rhythm: normal sinus rhythm  QRS Axis: normal  Intervals: normal  ST/T Wave abnormalities: normal   Conduction Disutrbances: none  Narrative Interpretation:   Old EKG Reviewed: No significant changes noted     MDM   Final diagnoses:  Palpitations    Patient is overall well-appearing.  Palpitations.  I think much of this may be panic attacks.  Patient be discharged home in good condition.  Instructed him to follow-up with her primary care physician.  He understands to return to the ER for new or worsening symptoms    Azalia BilisKevin Emberlie Gotcher, MD 11/15/15 1101

## 2015-11-15 NOTE — ED Notes (Signed)
Pt amb to room 12 with quick steady gait in nad. Pt reports both hands tingling at times, onset Saturday with eyes twitching at times. Pt talking rapidly, also reports generalized weakness, states "I was sitting around drinking beer with my friends when it happened Saturday, so I called 911 because i thought i was having a heart attack. They came out and said my ekg was normal and to follow up with my doctor." pt states he was at work this morning and googled his symptoms, and became concerned so he came here.

## 2015-11-17 ENCOUNTER — Encounter (HOSPITAL_BASED_OUTPATIENT_CLINIC_OR_DEPARTMENT_OTHER): Payer: Self-pay

## 2015-11-17 ENCOUNTER — Emergency Department (HOSPITAL_BASED_OUTPATIENT_CLINIC_OR_DEPARTMENT_OTHER)
Admission: EM | Admit: 2015-11-17 | Discharge: 2015-11-17 | Disposition: A | Discharge status: Home / Self Care | Payer: 59 | Attending: Physician Assistant | Admitting: Physician Assistant

## 2015-11-17 DIAGNOSIS — Z79899 Other long term (current) drug therapy: Secondary | ICD-10-CM | POA: Diagnosis not present

## 2015-11-17 DIAGNOSIS — F439 Reaction to severe stress, unspecified: Secondary | ICD-10-CM

## 2015-11-17 DIAGNOSIS — J45909 Unspecified asthma, uncomplicated: Secondary | ICD-10-CM | POA: Insufficient documentation

## 2015-11-17 DIAGNOSIS — R002 Palpitations: Secondary | ICD-10-CM | POA: Insufficient documentation

## 2015-11-17 DIAGNOSIS — F430 Acute stress reaction: Secondary | ICD-10-CM | POA: Insufficient documentation

## 2015-11-17 DIAGNOSIS — F17200 Nicotine dependence, unspecified, uncomplicated: Secondary | ICD-10-CM | POA: Insufficient documentation

## 2015-11-17 DIAGNOSIS — R200 Anesthesia of skin: Secondary | ICD-10-CM | POA: Diagnosis present

## 2015-11-17 LAB — CBG MONITORING, ED: Glucose-Capillary: 81 mg/dL (ref 65–99)

## 2015-11-17 MED ORDER — LORAZEPAM 1 MG PO TABS
1.0000 mg | ORAL_TABLET | Freq: Once | ORAL | Status: AC
  Administered 2015-11-17: 1 mg via ORAL
  Filled 2015-11-17: qty 1

## 2015-11-17 NOTE — ED Notes (Signed)
Nurse first note - Pt in NAD. Drove self to ED.  Presents for the same sx he was seen here for yesterday.  Sts he was supposed to f/u with pmd and was going to but when the sx came back he became concerned and came here.  Sx intermittently x5 days.

## 2015-11-17 NOTE — ED Provider Notes (Signed)
CSN: [id number]     Arrival date & time 11/17/15  1610 History   First MD Initiated Contact with Patient 11/17/15 1730     Chief Complaint  Patient presents with  . Numbness     (Consider location/radiation/quality/duration/timing/severity/associated sxs/prior Treatment) HPI Comments: Patient presents to the emergency department with chief complaint of numbness. He states that he was feeling stressed at work today, and fatigue. States that he felt his heart start racing, states that he needed to get out of the building. He states that he started filled numbness in his right arm, which then moved to his right leg, and then proceeded to move to his left leg. He states that his symptoms have improved since arriving in the emergency department. He has had palpitations from time to time. He was here for similar symptoms earlier this week. He denies any chest pain or shortness of breath. Denies any alcohol or drug use. There are no aggravating or alleviating factors.  The history is provided by the patient. No language interpreter was used.    Past Medical History  Diagnosis Date  . Asthma    Past Surgical History  Procedure Laterality Date  . Tonsillectomy     No family history on file. Social History  Substance Use Topics  . Smoking status: Current Some Day Smoker  . Smokeless tobacco: None  . Alcohol Use: 3.6 oz/week    6 Cans of beer per week    Review of Systems  Constitutional: Negative for fever and chills.  Respiratory: Negative for shortness of breath.   Cardiovascular: Negative for chest pain.  Gastrointestinal: Negative for nausea, vomiting, diarrhea and constipation.  Genitourinary: Negative for dysuria.  All other systems reviewed and are negative.     Allergies  Review of patient's allergies indicates no known allergies.  Home Medications   Prior to Admission medications   Medication Sig Start Date End Date Taking? Authorizing Provider  albuterol (PROVENTIL  HFA;VENTOLIN HFA) 108 (90 BASE) MCG/ACT inhaler Inhale 1-2 puffs into the lungs every 6 (six) hours as needed for wheezing or shortness of breath.     Historical Provider, MD  loratadine (CLARITIN) 10 MG tablet Take 10 mg by mouth daily.    Historical Provider, MD  Multiple Vitamin (MULTIVITAMIN WITH MINERALS) TABS tablet Take 1 tablet by mouth daily. If doesn't take tablet will drink smoothie    Historical Provider, MD  traMADol (ULTRAM) 50 MG tablet Take 1 tablet (50 mg total) by mouth every 6 (six) hours as needed. 11/30/14   Tiffany Neva SeatGreene, PA-C   BP 140/80 mmHg  Pulse 108  Temp(Src) 98.6 F (37 C) (Oral)  Resp 20  Ht 6\' 3"  (1.905 m)  Wt 115.667 kg  BMI 31.87 kg/m2  SpO2 100% Physical Exam  Constitutional: He is oriented to person, place, and time. He appears well-developed and well-nourished.  HENT:  Head: Normocephalic and atraumatic.  Eyes: Conjunctivae and EOM are normal. Pupils are equal, round, and reactive to light. Right eye exhibits no discharge. Left eye exhibits no discharge. No scleral icterus.  Neck: Normal range of motion. Neck supple. No JVD present.  Cardiovascular: Normal rate, regular rhythm and normal heart sounds.  Exam reveals no gallop and no friction rub.   No murmur heard. Pulmonary/Chest: Effort normal and breath sounds normal. No respiratory distress. He has no wheezes. He has no rales. He exhibits no tenderness.  Abdominal: Soft. He exhibits no distension and no mass. There is no tenderness. There is no rebound  and no guarding.  Musculoskeletal: Normal range of motion. He exhibits no edema or tenderness.  Neurological: He is alert and oriented to person, place, and time.  Skin: Skin is warm and dry.  Psychiatric: He has a normal mood and affect. His behavior is normal. Judgment and thought content normal.  Nursing note and vitals reviewed.   ED Course  Procedures (including critical care time) Results for orders placed or performed during the hospital  encounter of 11/17/15  POC CBG, ED  Result Value Ref Range   Glucose-Capillary 81 65 - 99 mg/dL   No results found.  I have personally reviewed and evaluated these images and lab results as part of my medical decision-making.   EKG Interpretation   Date/Time:  Wednesday November 17 2015 16:31:24 EST Ventricular Rate:  93 PR Interval:  166 QRS Duration: 88 QT Interval:  354 QTC Calculation: 440 R Axis:   70 Text Interpretation:  Normal sinus rhythm Cannot rule out Anterior infarct  , age undetermined Abnormal ECG no acute ischemia No significant change  since last tracing Confirmed by Kandis Mannan ([id number]) on 11/17/2015  6:27:24 PM      MDM   Final diagnoses:  Stress    Patient with symptoms consistent with anxiety versus panic attack. Will give a dose of Ativan. EKG is reassuring. Vital signs are stable. Patient is well-appearing. Patient expresses concerns about thyroid. I encouraged him to follow-up with his. I doubt that this is related to his thyroid, but he agrees to follow-up with PCP. As the numbness moves from his right arm to his right leg and then crosses to his left leg and associated with heart palpitations, and stress, feels this is anxiety versus panic attack.    Roxy Horseman, PA-C 11/17/15 2006  Courteney Randall An, MD 11/18/15 0025

## 2015-11-17 NOTE — Discharge Instructions (Signed)
Stress and Stress Management °Stress is a normal reaction to life events. It is what you feel when life demands more than you are used to or more than you can handle. Some stress can be useful. For example, the stress reaction can help you catch the last bus of the day, study for a test, or meet a deadline at work. But stress that occurs too often or for too long can cause problems. It can affect your emotional health and interfere with relationships and normal daily activities. Too much stress can weaken your immune system and increase your risk for physical illness. If you already have a medical problem, stress can make it worse. °CAUSES  °All sorts of life events may cause stress. An event that causes stress for one person may not be stressful for another person. Major life events commonly cause stress. These may be positive or negative. Examples include losing your job, moving into a new home, getting married, having a baby, or losing a loved one. Less obvious life events may also cause stress, especially if they occur day after day or in combination. Examples include working long hours, driving in traffic, caring for children, being in debt, or being in a difficult relationship. °SIGNS AND SYMPTOMS °Stress may cause emotional symptoms including, the following: °· Anxiety. This is feeling worried, afraid, on edge, overwhelmed, or out of control. °· Anger. This is feeling irritated or impatient. °· Depression. This is feeling sad, down, helpless, or guilty. °· Difficulty focusing, remembering, or making decisions. °Stress may cause physical symptoms, including the following:  °· Aches and pains. These may affect your head, neck, back, stomach, or other areas of your body. °· Tight muscles or clenched jaw. °· Low energy or trouble sleeping.  °Stress may cause unhealthy behaviors, including the following:  °· Eating to feel better (overeating) or skipping meals. °· Sleeping too little, too much, or both. °· Working  too much or putting off tasks (procrastination). °· Smoking, drinking alcohol, or using drugs to feel better. °DIAGNOSIS  °Stress is diagnosed through an assessment by your health care provider. Your health care provider will ask questions about your symptoms and any stressful life events. Your health care provider will also ask about your medical history and may order blood tests or other tests. Certain medical conditions and medicine can cause physical symptoms similar to stress.  Mental illness can cause emotional symptoms and unhealthy behaviors similar to stress. Your health care provider may refer you to a mental health professional for further evaluation.  °TREATMENT  °Stress management is the recommended treatment for stress. The goals of stress management are reducing stressful life events and coping with stress in healthy ways.  °Techniques for reducing stressful life events include the following: °· Stress identification. Self-monitor for stress and identify what causes stress for you. These skills may help you to avoid some stressful events. °· Time management. Set your priorities, keep a calendar of events, and learn to say "no." These tools can help you avoid making too many commitments. °Techniques for coping with stress include the following: °· Rethinking the problem. Try to think realistically about stressful events rather than ignoring them or overreacting. Try to find the positives in a stressful situation rather than focusing on the negatives. °· Exercise. Physical exercise can release both physical and emotional tension. The key is to find a form of exercise you enjoy and do it regularly. °· Relaxation techniques. These relax the body and mind. Examples include yoga, meditation, tai chi, biofeedback, deep   breathing, progressive muscle relaxation, listening to music, being out in nature, journaling, and other hobbies. Again, the key is to find one or more that you enjoy and can do  regularly.  Healthy lifestyle. Eat a balanced diet, get plenty of sleep, and do not smoke. Avoid using alcohol or drugs to relax.  Strong support network. Spend time with family, friends, or other people you enjoy being around.Express your feelings and talk things over with someone you trust. Counseling or talktherapy with a mental health professional may be helpful if you are having difficulty managing stress on your own. Medicine is typically not recommended for the treatment of stress.Talk to your health care provider if you think you need medicine for symptoms of stress. HOME CARE INSTRUCTIONS  Keep all follow-up visits as directed by your health care provider.  Take all medicines as directed by your health care provider. SEEK MEDICAL CARE IF:  Your symptoms get worse or you start having new symptoms.  You feel overwhelmed by your problems and can no longer manage them on your own. SEEK IMMEDIATE MEDICAL CARE IF:  You feel like hurting yourself or someone else.   This information is not intended to replace advice given to you by your health care provider. Make sure you discuss any questions you have with your health care provider.   Document Released: 04/18/2001 Document Revised: 11/13/2014 Document Reviewed: 06/17/2013 Elsevier Interactive Patient Education [employer].

## 2015-11-17 NOTE — ED Notes (Signed)
Pa  at bedside. 

## 2015-11-17 NOTE — ED Notes (Signed)
C/o numbness to "whole right side", light headed, palpitations-all s/s started Saturday-was seen here 11/15/15 for same c/o-today at work right hand started feeling stiff, "felt like everything is moving slow"-pt A/O-NAD-drove self to ED-presents to ED via w/c

## 2015-11-17 NOTE — ED Notes (Signed)
Pt states he has a ride

## 2016-02-29 ENCOUNTER — Encounter (HOSPITAL_COMMUNITY): Payer: Self-pay | Admitting: Emergency Medicine

## 2016-02-29 ENCOUNTER — Emergency Department (HOSPITAL_COMMUNITY)
Admission: EM | Admit: 2016-02-29 | Discharge: 2016-02-29 | Disposition: A | Discharge status: Home / Self Care | Payer: 59 | Attending: Emergency Medicine | Admitting: Emergency Medicine

## 2016-02-29 DIAGNOSIS — J302 Other seasonal allergic rhinitis: Secondary | ICD-10-CM

## 2016-02-29 DIAGNOSIS — J45909 Unspecified asthma, uncomplicated: Secondary | ICD-10-CM | POA: Insufficient documentation

## 2016-02-29 DIAGNOSIS — Z79899 Other long term (current) drug therapy: Secondary | ICD-10-CM | POA: Insufficient documentation

## 2016-02-29 DIAGNOSIS — J029 Acute pharyngitis, unspecified: Secondary | ICD-10-CM | POA: Diagnosis present

## 2016-02-29 DIAGNOSIS — F17200 Nicotine dependence, unspecified, uncomplicated: Secondary | ICD-10-CM | POA: Insufficient documentation

## 2016-02-29 LAB — RAPID STREP SCREEN (MED CTR MEBANE ONLY): STREPTOCOCCUS, GROUP A SCREEN (DIRECT): NEGATIVE

## 2016-02-29 MED ORDER — DEXAMETHASONE 4 MG PO TABS
10.0000 mg | ORAL_TABLET | Freq: Once | ORAL | Status: AC
  Administered 2016-02-29: 10 mg via ORAL
  Filled 2016-02-29: qty 2

## 2016-02-29 MED ORDER — OXYMETAZOLINE HCL 0.05 % NA SOLN
1.0000 | Freq: Once | NASAL | Status: AC
  Administered 2016-02-29: 1 via NASAL
  Filled 2016-02-29: qty 15

## 2016-02-29 NOTE — ED Provider Notes (Signed)
CSN: [id number]     Arrival date & time 02/29/16  1554 History   First MD Initiated Contact with Patient 02/29/16 1839     Chief Complaint  Patient presents with  . Sore Throat     Patient is a 25 y.o. male presenting with pharyngitis. The history is provided by the patient. No language interpreter was used.  Sore Throat   Avelino Gudino is a 25 y.o. male who presents to the Emergency Department complaining of sore throat.  He reports 3 days of a sensation of fullness and swelling in the back of his throat and upper mouth. He has nasal congestion and discharge. He has difficulty with swallowing due to this swelling sensation. He has occasional shortness of breath, none currently. He states his difficulty swallowing is improving currently as well. He denies any fever.  He had a cramp in his chest earlier, none now. He has a history of allergies, asthma, anxiety. He has been out of his Claritin for the last 4 days. Symptoms are moderate and waxing and waning.  Past Medical History  Diagnosis Date  . Asthma    Past Surgical History  Procedure Laterality Date  . Tonsillectomy     No family history on file. Social History  Substance Use Topics  . Smoking status: Current Some Day Smoker  . Smokeless tobacco: None  . Alcohol Use: 3.6 oz/week    6 Cans of beer per week    Review of Systems  All other systems reviewed and are negative.     Allergies  Review of patient's allergies indicates no known allergies.  Home Medications   Prior to Admission medications   Medication Sig Start Date End Date Taking? Authorizing Provider  albuterol (PROVENTIL HFA;VENTOLIN HFA) 108 (90 BASE) MCG/ACT inhaler Inhale 1-2 puffs into the lungs every 6 (six) hours as needed for wheezing or shortness of breath.    Yes Historical Provider, MD  atenolol (TENORMIN) 25 MG tablet Take 25 mg by mouth daily. 01/25/16  Yes Historical Provider, MD  loratadine (CLARITIN) 10 MG tablet Take 10 mg by mouth daily as  needed for allergies or itching. Reported on 02/29/2016   Yes Historical Provider, MD  Multiple Vitamin (MULTIVITAMIN WITH MINERALS) TABS tablet Take 1 tablet by mouth daily. If doesn't take tablet will drink smoothie   Yes Historical Provider, MD  traMADol (ULTRAM) 50 MG tablet Take 1 tablet (50 mg total) by mouth every 6 (six) hours as needed. Patient not taking: Reported on 02/29/2016 11/30/14   Marlon Pel, PA-C   BP 119/59 mmHg  Pulse 85  Temp(Src) 98.2 F (36.8 C) (Oral)  Resp 18  SpO2 97% Physical Exam  Constitutional: He is oriented to person, place, and time. He appears well-developed and well-nourished.  HENT:  Head: Normocephalic and atraumatic.  Mouth/Throat: Oropharynx is clear and moist.  No stridor. Boggy and swollen nasal mucosa bilaterally.  Cardiovascular: Normal rate and regular rhythm.   No murmur heard. Pulmonary/Chest: Effort normal and breath sounds normal. No respiratory distress.  Abdominal: Soft. There is no tenderness. There is no rebound and no guarding.  Musculoskeletal: He exhibits no edema or tenderness.  Neurological: He is alert and oriented to person, place, and time.  Skin: Skin is warm and dry.  Psychiatric: He has a normal mood and affect. His behavior is normal.  Nursing note and vitals reviewed.   ED Course  Procedures (including critical care time) Labs Review Labs Reviewed  RAPID STREP SCREEN (NOT AT [facility])  CULTURE, GROUP A STREP [facility])    Imaging Review No results found. I have personally reviewed and evaluated these images and lab results as part of my medical decision-making.   EKG Interpretation   Date/Time:  Tuesday February 29 2016 16:29:10 EDT Ventricular Rate:  85 PR Interval:  160 QRS Duration: 94 QT Interval:  343 QTC Calculation: 408 R Axis:   46 Text Interpretation:  Sinus rhythm Confirmed by Lincoln Brighamees, Liz [phone]([id number]) on  02/29/2016 7:13:28 PM      MDM   Final diagnoses:  Seasonal allergies    Patient here for  evaluation of nasal and throat congestion and sensation of swelling. He is nontoxic appearing on examination with no respiratory distress. History and exam is consistent with allergic rhinitis. Discussed home care, outpatient follow-up, return precautions.    Tilden FossaElizabeth Arno Cullers, MD 03/01/16 [phone]

## 2016-02-29 NOTE — ED Notes (Signed)
Pt reports sore throat x 3 days , sts very difficult to swallow, also reports shortness of breath. Was seen by his job nurse and advised to come to Ed for evaluation, he was told that his amygdales are swollen. Denies fever yet reports chest cramps .

## 2016-02-29 NOTE — Discharge Instructions (Signed)
You can take the afrin, one spray in each nostril for two more days.  Start taking your Claritin again.  Get rechecked immediately if you develop any new or worrisome symptoms.     Allergic Rhinitis Allergic rhinitis is when the mucous membranes in the nose respond to allergens. Allergens are particles in the air that cause your body to have an allergic reaction. This causes you to release allergic antibodies. Through a chain of events, these eventually cause you to release histamine into the blood stream. Although meant to protect the body, it is this release of histamine that causes your discomfort, such as frequent sneezing, congestion, and an itchy, runny nose.  CAUSES Seasonal allergic rhinitis (hay fever) is caused by pollen allergens that may come from grasses, trees, and weeds. Year-round allergic rhinitis (perennial allergic rhinitis) is caused by allergens such as house dust mites, pet dander, and mold spores. SYMPTOMS  Nasal stuffiness (congestion).  Itchy, runny nose with sneezing and tearing of the eyes. DIAGNOSIS Your health care provider can help you determine the allergen or allergens that trigger your symptoms. If you and your health care provider are unable to determine the allergen, skin or blood testing may be used. Your health care provider will diagnose your condition after taking your health history and performing a physical exam. Your health care provider may assess you for other related conditions, such as asthma, pink eye, or an ear infection. TREATMENT Allergic rhinitis does not have a cure, but it can be controlled by:  Medicines that block allergy symptoms. These may include allergy shots, nasal sprays, and oral antihistamines.  Avoiding the allergen. Hay fever may often be treated with antihistamines in pill or nasal spray forms. Antihistamines block the effects of histamine. There are over-the-counter medicines that may help with nasal congestion and swelling around  the eyes. Check with your health care provider before taking or giving this medicine. If avoiding the allergen or the medicine prescribed do not work, there are many new medicines your health care provider can prescribe. Stronger medicine may be used if initial measures are ineffective. Desensitizing injections can be used if medicine and avoidance does not work. Desensitization is when a patient is given ongoing shots until the body becomes less sensitive to the allergen. Make sure you follow up with your health care provider if problems continue. HOME CARE INSTRUCTIONS It is not possible to completely avoid allergens, but you can reduce your symptoms by taking steps to limit your exposure to them. It helps to know exactly what you are allergic to so that you can avoid your specific triggers. SEEK MEDICAL CARE IF:  You have a fever.  You develop a cough that does not stop easily (persistent).  You have shortness of breath.  You start wheezing.  Symptoms interfere with normal daily activities.   This information is not intended to replace advice given to you by your health care provider. Make sure you discuss any questions you have with your health care provider.   Document Released: 07/18/2001 Document Revised: 11/13/2014 Document Reviewed: 06/30/2013 Elsevier Interactive Patient Education [employer] Elsevier Inc.

## 2016-02-29 NOTE — ED Notes (Signed)
Patient was alert, oriented and stable upon discharge. RN went over AVS and patient had no further questions.  

## 2016-03-03 LAB — CULTURE, GROUP A STREP (THRC)

## 2016-05-07 ENCOUNTER — Encounter (HOSPITAL_COMMUNITY): Payer: Self-pay | Admitting: Emergency Medicine

## 2016-05-07 ENCOUNTER — Emergency Department (HOSPITAL_COMMUNITY)
Admission: EM | Admit: 2016-05-07 | Discharge: 2016-05-07 | Disposition: A | Discharge status: Home / Self Care | Payer: 59 | Attending: Emergency Medicine | Admitting: Emergency Medicine

## 2016-05-07 DIAGNOSIS — K047 Periapical abscess without sinus: Secondary | ICD-10-CM

## 2016-05-07 DIAGNOSIS — J45909 Unspecified asthma, uncomplicated: Secondary | ICD-10-CM | POA: Diagnosis not present

## 2016-05-07 DIAGNOSIS — K0889 Other specified disorders of teeth and supporting structures: Secondary | ICD-10-CM | POA: Diagnosis present

## 2016-05-07 DIAGNOSIS — F17200 Nicotine dependence, unspecified, uncomplicated: Secondary | ICD-10-CM | POA: Insufficient documentation

## 2016-05-07 DIAGNOSIS — Z79899 Other long term (current) drug therapy: Secondary | ICD-10-CM | POA: Diagnosis not present

## 2016-05-07 DIAGNOSIS — K029 Dental caries, unspecified: Secondary | ICD-10-CM | POA: Insufficient documentation

## 2016-05-07 HISTORY — DX: Anxiety disorder, unspecified: F41.9

## 2016-05-07 MED ORDER — CLINDAMYCIN HCL 300 MG PO CAPS: 300.0000 mg | ORAL_CAPSULE | Freq: Three times a day (TID) | ORAL | Status: DC

## 2016-05-07 MED ORDER — CLINDAMYCIN HCL 150 MG PO CAPS
300.0000 mg | ORAL_CAPSULE | Freq: Once | ORAL | Status: AC
  Administered 2016-05-07: 300 mg via ORAL
  Filled 2016-05-07: qty 2

## 2016-05-07 MED ORDER — IBUPROFEN 800 MG PO TABS
800.0000 mg | ORAL_TABLET | Freq: Once | ORAL | Status: AC
  Administered 2016-05-07: 800 mg via ORAL
  Filled 2016-05-07: qty 1

## 2016-05-07 MED ORDER — TRAMADOL HCL 50 MG PO TABS: 50.0000 mg | ORAL_TABLET | Freq: Four times a day (QID) | ORAL | Status: DC | PRN

## 2016-05-07 MED ORDER — IBUPROFEN 600 MG PO TABS: 600.0000 mg | ORAL_TABLET | Freq: Four times a day (QID) | ORAL | Status: DC | PRN

## 2016-05-07 MED ORDER — TRAMADOL HCL 50 MG PO TABS
100.0000 mg | ORAL_TABLET | Freq: Once | ORAL | Status: AC
  Administered 2016-05-07: 100 mg via ORAL
  Filled 2016-05-07: qty 2

## 2016-05-07 MED ORDER — ONDANSETRON HCL 4 MG PO TABS
4.0000 mg | ORAL_TABLET | Freq: Once | ORAL | Status: AC
  Administered 2016-05-07: 4 mg via ORAL
  Filled 2016-05-07: qty 1

## 2016-05-07 NOTE — ED Notes (Signed)
Patient verbalizes understanding of discharge instructions, prescriptions, home care and follow up care. Patient out of department at this time. 

## 2016-05-07 NOTE — Discharge Instructions (Signed)
Please use clindamycin and ibuprofen daily. Use Ultram for more severe pain. Ultram may cause drowsiness, please use with caution. See your dentist as soon as possible. Dental Caries Dental caries is tooth decay. This decay can cause a hole in teeth (cavity) that can get bigger and deeper over time. HOME CARE  Brush and floss your teeth. Do this at least two times a day.  Use a fluoride toothpaste.  Use a mouth rinse if told by your dentist or doctor.  Eat less sugary and starchy foods. Drink less sugary drinks.  Avoid snacking often on sugary and starchy foods. Avoid sipping often on sugary drinks.  Keep regular checkups and cleanings with your dentist.  Use fluoride supplements if told by your dentist or doctor.  Allow fluoride to be applied to teeth if told by your dentist or doctor.   This information is not intended to replace advice given to you by your health care provider. Make sure you discuss any questions you have with your health care provider.   Document Released: 08/01/2008 Document Revised: 11/13/2014 Document Reviewed: 10/25/2012 Elsevier Interactive Patient Education [employer] Elsevier Inc.

## 2016-05-07 NOTE — ED Provider Notes (Signed)
History  By signing my name below, I, Earmon PhoenixJennifer Waddell, attest that this documentation has been prepared under the direction and in the presence of Ivery QualeHobson Steffan Caniglia, PA-C. Electronically Signed: Earmon PhoenixJennifer Waddell, ED Scribe. 05/07/2016. 4:20 PM.  Chief Complaint  Patient presents with  . Dental Pain   The history is provided by the patient and medical records. No language interpreter was used.    HPI Comments:  Gerald Cook is a 25 y.o. male who presents to the Emergency Department complaining of left lower dental pain that began about 36 hours ago. Pt reports he feels as if he has an abscess to the left lower gumline. Pt reports generalized fatigue and subjective fever. He has not taken anything for pain. Eating increases the pain. He denies alleviating factors. He denies nausea, vomiting, difficulty breathing or swallowing. He denies allergies to any medications. He plans to schedule a dentist appt in the upcoming week.  Past Medical History  Diagnosis Date  . Asthma   . Anxiety    Past Surgical History  Procedure Laterality Date  . Tonsillectomy     History reviewed. No pertinent family history. Social History  Substance Use Topics  . Smoking status: Current Some Day Smoker  . Smokeless tobacco: None  . Alcohol Use: 3.6 oz/week    6 Cans of beer per week    Review of Systems  Constitutional: Positive for fever (subjective) and fatigue (generalized).  HENT: Positive for dental problem. Negative for trouble swallowing.   Gastrointestinal: Negative for nausea and vomiting.  All other systems reviewed and are negative.   Allergies  Review of patient's allergies indicates no known allergies.  Home Medications   Prior to Admission medications   Medication Sig Start Date End Date Taking? Authorizing Provider  albuterol (PROVENTIL HFA;VENTOLIN HFA) 108 (90 BASE) MCG/ACT inhaler Inhale 1-2 puffs into the lungs every 6 (six) hours as needed for wheezing or shortness of breath.      Historical Provider, MD  atenolol (TENORMIN) 25 MG tablet Take 25 mg by mouth daily. 01/25/16   Historical Provider, MD  loratadine (CLARITIN) 10 MG tablet Take 10 mg by mouth daily as needed for allergies or itching. Reported on 02/29/2016    Historical Provider, MD  Multiple Vitamin (MULTIVITAMIN WITH MINERALS) TABS tablet Take 1 tablet by mouth daily. If doesn't take tablet will drink smoothie    Historical Provider, MD  traMADol (ULTRAM) 50 MG tablet Take 1 tablet (50 mg total) by mouth every 6 (six) hours as needed. Patient not taking: Reported on 02/29/2016 11/30/14   Marlon Peliffany Greene, PA-C   Triage Vitals: Pulse 115  Temp(Src) 99.1 F (37.3 C) (Oral)  Resp 18  Ht 6\' 5"  (1.956 m)  Wt 350 lb (158.759 kg)  BMI 41.50 kg/m2  SpO2 98% Physical Exam  Constitutional: He is oriented to person, place, and time. He appears well-developed and well-nourished.  HENT:  Head: Normocephalic and atraumatic.  Cavity of second left lower molar. Third molar erupting through gumline. Swelling from 1st to 3rd lower molars. No swelling under tongue. Airway patent. No temperature changes of right or left face.  Eyes: EOM are normal.  Neck: Normal range of motion.  Cardiovascular: Regular rhythm and normal heart sounds.  Tachycardia present.  Exam reveals no gallop and no friction rub.   No murmur heard. Pulmonary/Chest: Effort normal and breath sounds normal. No respiratory distress. He has no wheezes. He has no rales.  Musculoskeletal: Normal range of motion.  Lymphadenopathy:    He  has no cervical adenopathy.  Neurological: He is alert and oriented to person, place, and time.  Skin: Skin is warm and dry.  Psychiatric: He has a normal mood and affect. His behavior is normal.  Nursing note and vitals reviewed.   ED Course  Procedures (including critical care time) DIAGNOSTIC STUDIES: Oxygen Saturation is 98% on RA, normal by my interpretation.   COORDINATION OF CARE: 4:19 PM- Will prescribe  Clindamycin and encouraged pt to make dentist appt. Pt verbalizes understanding and agrees to plan.  Medications - No data to display  Labs Review Labs Reviewed - No data to display  Imaging Review No results found. I have personally reviewed and evaluated these images and lab results as part of my medical decision-making.   EKG Interpretation None      MDM  Vital signs reviewed. Temperature is 99.1. Pulse rate is 115. Patient has an infected lower molar, and a third molar that is erupting through the skin. The plan at this time is for the patient be treated with clindamycin, Ultram, and ibuprofen. I've instructed the patient to see a dentist as soon as possible.    Final diagnoses:  Infected dental caries    *I have reviewed nursing notes, vital signs, and all appropriate lab and imaging results for this patient.**  I personally performed the services described in this documentation, which was scribed in my presence. The recorded information has been reviewed and is accurate.    Ivery QualeHobson Lenah Messenger, PA-C 05/07/16 1636  Bethann BerkshireJoseph Zammit, MD 05/08/16 [phone]

## 2016-05-07 NOTE — ED Notes (Signed)
Pt c/o left lower toothache since yesterday  

## 2016-05-11 ENCOUNTER — Emergency Department (HOSPITAL_COMMUNITY)
Admission: EM | Admit: 2016-05-11 | Discharge: 2016-05-11 | Disposition: A | Discharge status: Home / Self Care | Payer: 59 | Attending: Emergency Medicine | Admitting: Emergency Medicine

## 2016-05-11 ENCOUNTER — Encounter (HOSPITAL_COMMUNITY): Payer: Self-pay | Admitting: Emergency Medicine

## 2016-05-11 DIAGNOSIS — Z79899 Other long term (current) drug therapy: Secondary | ICD-10-CM | POA: Diagnosis not present

## 2016-05-11 DIAGNOSIS — K047 Periapical abscess without sinus: Secondary | ICD-10-CM | POA: Diagnosis not present

## 2016-05-11 DIAGNOSIS — F17200 Nicotine dependence, unspecified, uncomplicated: Secondary | ICD-10-CM | POA: Diagnosis not present

## 2016-05-11 DIAGNOSIS — R5383 Other fatigue: Secondary | ICD-10-CM | POA: Diagnosis present

## 2016-05-11 DIAGNOSIS — J45909 Unspecified asthma, uncomplicated: Secondary | ICD-10-CM | POA: Insufficient documentation

## 2016-05-11 LAB — URINALYSIS, ROUTINE W REFLEX MICROSCOPIC
Bilirubin Urine: NEGATIVE
GLUCOSE, UA: NEGATIVE mg/dL
Hgb urine dipstick: NEGATIVE
Ketones, ur: NEGATIVE mg/dL
LEUKOCYTES UA: NEGATIVE
Nitrite: NEGATIVE
PH: 6 (ref 5.0–8.0)
PROTEIN: NEGATIVE mg/dL
SPECIFIC GRAVITY, URINE: 1.02 (ref 1.005–1.030)

## 2016-05-11 LAB — CBC WITH DIFFERENTIAL/PLATELET
BASOS ABS: 0 10*3/uL (ref 0.0–0.1)
Basophils Relative: 0 %
EOS PCT: 0 %
Eosinophils Absolute: 0 10*3/uL (ref 0.0–0.7)
HEMATOCRIT: 43.1 % (ref 39.0–52.0)
HEMOGLOBIN: 14.9 g/dL (ref 13.0–17.0)
LYMPHS ABS: 1.3 10*3/uL (ref 0.7–4.0)
LYMPHS PCT: 19 %
MCH: 31.9 pg (ref 26.0–34.0)
MCHC: 34.6 g/dL (ref 30.0–36.0)
MCV: 92.3 fL (ref 78.0–100.0)
Monocytes Absolute: 0.4 10*3/uL (ref 0.1–1.0)
Monocytes Relative: 5 %
NEUTROS ABS: 5.2 10*3/uL (ref 1.7–7.7)
NEUTROS PCT: 76 %
Platelets: 289 10*3/uL (ref 150–400)
RBC: 4.67 MIL/uL (ref 4.22–5.81)
RDW: 11.5 % (ref 11.5–15.5)
WBC: 6.8 10*3/uL (ref 4.0–10.5)

## 2016-05-11 LAB — BASIC METABOLIC PANEL
ANION GAP: 9 (ref 5–15)
BUN: 13 mg/dL (ref 6–20)
CHLORIDE: 101 mmol/L (ref 101–111)
CO2: 26 mmol/L (ref 22–32)
Calcium: 9.3 mg/dL (ref 8.9–10.3)
Creatinine, Ser: 1.17 mg/dL (ref 0.61–1.24)
GFR calc Af Amer: >60 mL/min (ref >60)
GLUCOSE: 104 mg/dL — AB (ref 65–99)
POTASSIUM: 3.8 mmol/L (ref 3.5–5.1)
Sodium: 136 mmol/L (ref 135–145)

## 2016-05-11 MED ORDER — AMOXICILLIN 500 MG PO CAPS: 500.0000 mg | ORAL_CAPSULE | Freq: Three times a day (TID) | ORAL | Status: AC

## 2016-05-11 MED ORDER — AMOXICILLIN 250 MG PO CAPS
500.0000 mg | ORAL_CAPSULE | Freq: Once | ORAL | Status: AC
  Administered 2016-05-11: 500 mg via ORAL
  Filled 2016-05-11: qty 2

## 2016-05-11 NOTE — ED Provider Notes (Signed)
CSN: [id number]     Arrival date & time 05/11/16  1842 History   First MD Initiated Contact with Patient 05/11/16 2051     Chief Complaint  Patient presents with  . Fatigue     (Consider location/radiation/quality/duration/timing/severity/associated sxs/prior Treatment) The history is provided by the patient.   Gerald Cook is a 25 y.o. male presenting with several complaints, the first being fatigue and generalized weakness today along along with anorexia secondary to dental abscess pain.  He states he has been able to tolerate PO fluids but has ate very little today secondary to pain.  He endorses having a prolonged episode of feeling lightheaded while at work today and briefly felthe was going to pass out.  He denies chest pain, sob, palpitations, nausea, vomiting, diaphoresis, fever or headache.  He was seen here 4 days ago and prescribed clindamycin for the dental infection but has not been able to get filled due to cost.  His is working on getting referral to an Transport planneroral surgeon through his dentist.   Past Medical History  Diagnosis Date  . Asthma   . Anxiety    Past Surgical History  Procedure Laterality Date  . Tonsillectomy     History reviewed. No pertinent family history. Social History  Substance Use Topics  . Smoking status: Current Some Day Smoker  . Smokeless tobacco: None  . Alcohol Use: 3.6 oz/week    6 Cans of beer per week    Review of Systems  Constitutional: Positive for fatigue. Negative for fever and chills.  HENT: Positive for dental problem. Negative for congestion and sore throat.   Eyes: Negative.   Respiratory: Negative for chest tightness and shortness of breath.   Cardiovascular: Negative for chest pain and palpitations.  Gastrointestinal: Negative for nausea, vomiting and abdominal pain.  Genitourinary: Negative.   Musculoskeletal: Negative for joint swelling, arthralgias and neck pain.  Skin: Negative.  Negative for rash and wound.  Neurological:  Positive for light-headedness. Negative for dizziness, weakness, numbness and headaches.  Psychiatric/Behavioral: Negative.       Allergies  Review of patient's allergies indicates no known allergies.  Home Medications   Prior to Admission medications   Medication Sig Start Date End Date Taking? Authorizing Provider  albuterol (PROVENTIL HFA;VENTOLIN HFA) 108 (90 BASE) MCG/ACT inhaler Inhale 1-2 puffs into the lungs every 6 (six) hours as needed for wheezing or shortness of breath.    Yes Historical Provider, MD  atenolol (TENORMIN) 25 MG tablet Take 25 mg by mouth daily. 01/25/16  Yes Historical Provider, MD  loratadine (CLARITIN) 10 MG tablet Take 10 mg by mouth daily as needed for allergies or itching. Reported on 02/29/2016   Yes Historical Provider, MD  Multiple Vitamin (MULTIVITAMIN WITH MINERALS) TABS tablet Take 1 tablet by mouth daily. If doesn't take tablet will drink smoothie   Yes Historical Provider, MD  amoxicillin (AMOXIL) 500 MG capsule Take 1 capsule (500 mg total) by mouth 3 (three) times daily. 05/11/16 05/21/16  Burgess AmorJulie Tobi Groesbeck, PA-C   BP 137/88 mmHg  Pulse 95  Temp(Src) 99 F (37.2 C) (Oral)  Resp 16  Ht 6\' 5"  (1.956 m)  Wt 158.759 kg  BMI 41.50 kg/m2  SpO2 98% Physical Exam  Constitutional: He is oriented to person, place, and time. He appears well-developed and well-nourished. No distress.  HENT:  Head: Normocephalic and atraumatic.  Right Ear: Tympanic membrane and external ear normal.  Left Ear: Tympanic membrane and external ear normal.  Mouth/Throat: Uvula is midline,  oropharynx is clear and moist and mucous membranes are normal. No oral lesions. No trismus in the jaw. Dental abscesses present.    Gingival edema right lower molars, no fluctuant abscess. Cheek without edema or erythema. Sublingual space is soft.  Eyes: Conjunctivae are normal.  Neck: Normal range of motion. Neck supple.  Cardiovascular: Normal rate and normal heart sounds.   Pulmonary/Chest:  Effort normal. No respiratory distress.  Abdominal: Soft. He exhibits no distension. There is no tenderness.  Musculoskeletal: Normal range of motion.  Lymphadenopathy:    He has no cervical adenopathy.  Neurological: He is alert and oriented to person, place, and time.  Skin: Skin is warm and dry. No erythema.  Psychiatric: He has a normal mood and affect.    ED Course  Procedures (including critical care time) Labs Review Labs Reviewed  BASIC METABOLIC PANEL - Abnormal; Notable for the following:    Glucose, Bld 104 (*)    All other components within normal limits  CBC WITH DIFFERENTIAL/PLATELET  URINALYSIS, ROUTINE W REFLEX MICROSCOPIC (NOT [facility])    Imaging Review No results found. I have personally reviewed and evaluated these images and lab results as part of my medical decision-making.   EKG Interpretation   Date/Time:  Thursday May 11 2016 21:51:23 EDT Ventricular Rate:  84 PR Interval:    QRS Duration: 86 QT Interval:  340 QTC Calculation: 402 R Axis:   78 Text Interpretation:  Sinus rhythm Borderline repolarization abnormality  Borderline ST elevation, lateral leads No significant change was found  Confirmed by Manus GunningANCOUR  MD, STEPHEN [id number]) on 05/11/2016 10:09:07 PM      MDM   Final diagnoses:  Dental abscess    Labs and orthostatic VSS reviewed. Pt in no acute distress, no evidence for dehydration or cardiac source of pre syncopal episode.  He was placed on amoxil in place of clindamycin which he states he will be able to get filled.  Advised ibuprofen prn pain. Maintain fluid, soft food intake, f/u with dentistry/oral surgery as planned.  The patient appears reasonably screened and/or stabilized for discharge and I doubt any other medical condition or other [facility] requiring further screening, evaluation, or treatment in the ED at this time prior to discharge.     Burgess AmorJulie Tannisha Kennington, PA-C 05/12/16 1133  Glynn OctaveStephen Rancour, MD 05/16/16 [phone]

## 2016-05-11 NOTE — ED Notes (Signed)
Pt c/o dental pain, weakness, and states his stomach feels funny. Pt was seen for dental pain, but cannot afford prescriptions.

## 2016-05-11 NOTE — Discharge Instructions (Signed)
Dental Abscess A dental abscess is a collection of pus in or around a tooth. CAUSES This condition is caused by a bacterial infection around the root of the tooth that involves the inner part of the tooth (pulp). It may result from:  Severe tooth decay.  Trauma to the tooth that allows bacteria to enter into the pulp, such as a broken or chipped tooth.  Severe gum disease around a tooth. SYMPTOMS Symptoms of this condition include:  Severe pain in and around the infected tooth.  Swelling and redness around the infected tooth, in the mouth, or in the face.  Tenderness.  Pus drainage.  Bad breath.  Bitter taste in the mouth.  Difficulty swallowing.  Difficulty opening the mouth.  Nausea.  Vomiting.  Chills.  Swollen neck glands.  Fever. DIAGNOSIS This condition is diagnosed with examination of the infected tooth. During the exam, your dentist may tap on the infected tooth. Your dentist will also ask about your medical and dental history and may order X-rays. TREATMENT This condition is treated by eliminating the infection. This may be done with:  Antibiotic medicine.  A root canal. This may be performed to save the tooth.  Pulling (extracting) the tooth. This may also involve draining the abscess. This is done if the tooth cannot be saved. HOME CARE INSTRUCTIONS  Take medicines only as directed by your dentist.  If you were prescribed antibiotic medicine, finish all of it even if you start to feel better.  Rinse your mouth (gargle) often with salt water to relieve pain or swelling.  Do not drive or operate heavy machinery while taking pain medicine.  Do not apply heat to the outside of your mouth.  Keep all follow-up visits as directed by your dentist. This is important. SEEK MEDICAL CARE IF:  Your pain is worse and is not helped by medicine. SEEK IMMEDIATE MEDICAL CARE IF:  You have a fever or chills.  Your symptoms suddenly get worse.  You have a  very bad headache.  You have problems breathing or swallowing.  You have trouble opening your mouth.  You have swelling in your neck or around your eye.   This information is not intended to replace advice given to you by your health care provider. Make sure you discuss any questions you have with your health care provider.   Document Released: 10/23/2005 Document Revised: 03/09/2015 Document Reviewed: 10/20/2014 Elsevier Interactive Patient Education 2016 [employer]   Rest and make sure you are drinking plenty of fluids.  Take your next dose of amoxil (which is a penicillin) tomorrow morning.  You are being given this medicine in place of the clindamycin since this medicine has been difficult for you to obtain. Followup with your dentist as planned.

## 2017-04-28 ENCOUNTER — Emergency Department (HOSPITAL_COMMUNITY): Payer: 59

## 2017-04-28 ENCOUNTER — Encounter (HOSPITAL_COMMUNITY): Payer: Self-pay

## 2017-04-28 ENCOUNTER — Emergency Department (HOSPITAL_COMMUNITY)
Admission: EM | Admit: 2017-04-28 | Discharge: 2017-04-28 | Disposition: A | Discharge status: Home / Self Care | Payer: 59 | Attending: Emergency Medicine | Admitting: Emergency Medicine

## 2017-04-28 DIAGNOSIS — J45909 Unspecified asthma, uncomplicated: Secondary | ICD-10-CM | POA: Insufficient documentation

## 2017-04-28 DIAGNOSIS — K219 Gastro-esophageal reflux disease without esophagitis: Secondary | ICD-10-CM | POA: Insufficient documentation

## 2017-04-28 DIAGNOSIS — F17200 Nicotine dependence, unspecified, uncomplicated: Secondary | ICD-10-CM | POA: Insufficient documentation

## 2017-04-28 LAB — URINALYSIS, ROUTINE W REFLEX MICROSCOPIC
BILIRUBIN URINE: NEGATIVE
Glucose, UA: NEGATIVE mg/dL
Hgb urine dipstick: NEGATIVE
KETONES UR: NEGATIVE mg/dL
LEUKOCYTES UA: NEGATIVE
NITRITE: NEGATIVE
PROTEIN: NEGATIVE mg/dL
Specific Gravity, Urine: 1.01 (ref 1.005–1.030)
pH: 6.5 (ref 5.0–8.0)

## 2017-04-28 LAB — CBC
HEMATOCRIT: 43.1 % (ref 39.0–52.0)
Hemoglobin: 15.1 g/dL (ref 13.0–17.0)
MCH: 31.9 pg (ref 26.0–34.0)
MCHC: 35 g/dL (ref 30.0–36.0)
MCV: 91.1 fL (ref 78.0–100.0)
PLATELETS: 274 10*3/uL (ref 150–400)
RBC: 4.73 MIL/uL (ref 4.22–5.81)
RDW: 11.8 % (ref 11.5–15.5)
WBC: 5.3 10*3/uL (ref 4.0–10.5)

## 2017-04-28 LAB — COMPREHENSIVE METABOLIC PANEL
ALK PHOS: 53 U/L (ref 38–126)
ALT: 39 U/L (ref 17–63)
AST: 27 U/L (ref 15–41)
Albumin: 4.4 g/dL (ref 3.5–5.0)
Anion gap: 9 (ref 5–15)
BILIRUBIN TOTAL: 1.4 mg/dL — AB (ref 0.3–1.2)
BUN: 8 mg/dL (ref 6–20)
CALCIUM: 9.6 mg/dL (ref 8.9–10.3)
CO2: 27 mmol/L (ref 22–32)
Chloride: 105 mmol/L (ref 101–111)
Creatinine, Ser: 1.09 mg/dL (ref 0.61–1.24)
GFR calc Af Amer: >60 mL/min (ref >60)
Glucose, Bld: 94 mg/dL (ref 65–99)
POTASSIUM: 3.8 mmol/L (ref 3.5–5.1)
Sodium: 141 mmol/L (ref 135–145)
TOTAL PROTEIN: 7.7 g/dL (ref 6.5–8.1)

## 2017-04-28 LAB — I-STAT TROPONIN, ED: TROPONIN I, POC: 0.01 ng/mL (ref 0.00–0.08)

## 2017-04-28 LAB — LIPASE, BLOOD: Lipase: 14 U/L (ref 11–51)

## 2017-04-28 MED ORDER — FAMOTIDINE 10 MG PO CHEW: 10.0000 mg | CHEWABLE_TABLET | Freq: Two times a day (BID) | ORAL | 0 refills | Status: AC

## 2017-04-28 NOTE — ED Notes (Signed)
Pt verbalizes generalized abdominal burning and not peeing as much onset yesterday. Pt denies n/v/d or other GU symptoms. Pt continues to verbalize new onset chest pain at 1300 today. Pt describes chest pain as starting from right chest muscle ache pain radiating to left chest electric pain to central chest burning.

## 2017-04-28 NOTE — Discharge Instructions (Signed)
[facility] or prilosec OTC, follow up with your primary care doctor if the symptoms do not resolve over the next several days, return for for fever, vomiting, worsening symptoms

## 2017-04-28 NOTE — ED Notes (Signed)
Pt in DG at present time. 

## 2017-04-28 NOTE — ED Triage Notes (Signed)
Pt has been having chest pain starting this morning.  Pt states pain has moved from rt to left.  Thought it was reflux and has been taking tums and pepto with no relief.  Now with abdominal pain in left lower quad radiating across abdomen.  States some difficulty urinating.  Pt denies n/v or fever.

## 2017-04-28 NOTE — ED Provider Notes (Signed)
[facility]-EMERGENCY DEPT Provider Note   CSN: [id number] Arrival date & time: 04/28/17  1346     History   Chief Complaint Chief Complaint  Patient presents with  . Abdominal Pain  . Chest Pain    HPI Gerald Cook is a 26 y.o. male.  HPI Patient presented to the emergency room for evaluation of chest and abdominal pain. Patient was getting his haircut. He started having burning pain on his right side of his chest that then moved to his left. It then migrated more towards his lower abdomen onto the left side. Patient denies any difficulty urinating. He denies any fever. He denies any vomiting or diarrhea. He thought his symptoms could be related to acid reflux. He took Pepto-Bismol and Tums but it did not help much so he presented to the emergency room to make sure there is nothing else going on. Past Medical History:  Diagnosis Date  . Anxiety   . Asthma     There are no active problems to display for this patient.   Past Surgical History:  Procedure Laterality Date  . TONSILLECTOMY         Home Medications    Prior to Admission medications   Medication Sig Start Date End Date Taking? Authorizing Provider  albuterol (PROVENTIL HFA;VENTOLIN HFA) 108 (90 BASE) MCG/ACT inhaler Inhale 1-2 puffs into the lungs every 6 (six) hours as needed for wheezing or shortness of breath.     [provider]  atenolol (TENORMIN) 25 MG tablet Take 25 mg by mouth daily. 01/25/16   [provider]  famotidine (PEPCID AC) 10 MG chewable tablet Chew 1 tablet (10 mg total) by mouth 2 (two) times daily. 04/28/17 05/08/17  Linwood Dibbles, MD  loratadine (CLARITIN) 10 MG tablet Take 10 mg by mouth daily as needed for allergies or itching. Reported on 02/29/2016    [provider]  Multiple Vitamin (MULTIVITAMIN WITH MINERALS) TABS tablet Take 1 tablet by mouth daily. If doesn't take tablet will drink smoothie    [provider]    Family History History reviewed. No  pertinent family history.  Social History Social History  Substance Use Topics  . Smoking status: Current Some Day Smoker  . Smokeless tobacco: Never Used  . Alcohol use Yes     Comment: social     Allergies   Patient has no known allergies.   Review of Systems Review of Systems  All other systems reviewed and are negative.    Physical Exam Updated Vital Signs BP (!) 140/91 (BP Location: Left Arm)   Pulse 95   Temp 98 F (36.7 C) (Oral)   Resp 16   Ht 1.93 m (6\' 4" )   Wt (!) 137.9 kg (304 lb)   SpO2 99%   BMI 37.00 kg/m   Physical Exam  Constitutional: He appears well-developed and well-nourished. No distress.  HENT:  Head: Normocephalic and atraumatic.  Right Ear: External ear normal.  Left Ear: External ear normal.  Eyes: Conjunctivae are normal. Right eye exhibits no discharge. Left eye exhibits no discharge. No scleral icterus.  Neck: Neck supple. No tracheal deviation present.  Cardiovascular: Normal rate, regular rhythm and intact distal pulses.   Pulmonary/Chest: Effort normal and breath sounds normal. No stridor. No respiratory distress. He has no wheezes. He has no rales.  Abdominal: Soft. Bowel sounds are normal. He exhibits no distension. There is no tenderness. There is no rebound and no guarding.  Musculoskeletal: He exhibits no edema or tenderness.  Neurological: He is alert. He has normal strength. No cranial nerve deficit (no facial droop, extraocular movements intact, no slurred speech) or sensory deficit. He exhibits normal muscle tone. He displays no seizure activity. Coordination normal.  Skin: Skin is warm and dry. No rash noted.  Psychiatric: He has a normal mood and affect.  Nursing note and vitals reviewed.    ED Treatments / Results  Labs (all labs ordered are listed, but only abnormal results are displayed) Labs Reviewed  COMPREHENSIVE METABOLIC PANEL - Abnormal; Notable for the following:       Result Value   Total Bilirubin 1.4  (*)    All other components within normal limits  LIPASE, BLOOD  CBC  URINALYSIS, ROUTINE W REFLEX MICROSCOPIC  I-STAT TROPOININ, ED    EKG  EKG Interpretation  Date/Time:  Saturday April 28 2017 14:00:06 EDT Ventricular Rate:  90 PR Interval:    QRS Duration: 92 QT Interval:  348 QTC Calculation: 426 R Axis:   60 Text Interpretation:  Sinus rhythm Borderline T wave abnormalities No significant change since last tracing Confirmed by Linwood DibblesKnapp, Yona Stansbury [phone]([id number]) on 04/28/2017 3:13:39 PM       Radiology Dg Chest 2 View  Result Date: 04/28/2017 CLINICAL DATA:  26 year old male with a history of chest pain EXAM: CHEST  2 VIEW COMPARISON:  11/30/2014 FINDINGS: Cardiomediastinal silhouette within normal limits. No pneumothorax, pleural effusion, confluent airspace disease. No central vascular congestion. No displaced fracture IMPRESSION: No radiographic evidence of acute cardiopulmonary disease Electronically Signed   By: Gilmer MorJaime  Wagner D.O.   On: 04/28/2017 14:28    Procedures Procedures (including critical care time)  Medications Ordered in ED Medications - No data to display   Initial Impression / Assessment and Plan / ED Course  I have reviewed the triage vital signs and the nursing notes.  Pertinent labs & imaging results that were available during my care of the patient were reviewed by me and considered in my medical decision making (see chart for details).   the patient's exam is reassuring. His abdominal exam is benign. His laboratory tests and EKG are reassuring. I doubt acute coronary syndrome, pulmonary embolism, appendicitis, cholecystitis, pancreatitis or other emergent condition. I think his symptoms may be related to gastroesophageal reflux and esophagitis. Plan on discharge home with over-the-counter Prilosec or Pepcid AC. Follow-up with his doctor if symptoms are not improving. Return for fever vomiting worsening symptoms  Final Clinical Impressions(s) / ED Diagnoses    Final diagnoses:  Gastroesophageal reflux disease, esophagitis presence not specified    New Prescriptions New Prescriptions   FAMOTIDINE (PEPCID AC) 10 MG CHEWABLE TABLET    Chew 1 tablet (10 mg total) by mouth 2 (two) times daily.     Linwood DibblesKnapp, Marquise Wicke, MD 04/28/17 [phone]

## 2017-05-25 ENCOUNTER — Encounter (HOSPITAL_BASED_OUTPATIENT_CLINIC_OR_DEPARTMENT_OTHER): Payer: Self-pay

## 2017-05-25 ENCOUNTER — Emergency Department (HOSPITAL_BASED_OUTPATIENT_CLINIC_OR_DEPARTMENT_OTHER): Payer: 59

## 2017-05-25 ENCOUNTER — Emergency Department (HOSPITAL_BASED_OUTPATIENT_CLINIC_OR_DEPARTMENT_OTHER)
Admission: EM | Admit: 2017-05-25 | Discharge: 2017-05-25 | Disposition: A | Discharge status: Home / Self Care | Payer: 59 | Attending: Emergency Medicine | Admitting: Emergency Medicine

## 2017-05-25 DIAGNOSIS — R0789 Other chest pain: Secondary | ICD-10-CM | POA: Insufficient documentation

## 2017-05-25 DIAGNOSIS — J45909 Unspecified asthma, uncomplicated: Secondary | ICD-10-CM | POA: Insufficient documentation

## 2017-05-25 DIAGNOSIS — Z79899 Other long term (current) drug therapy: Secondary | ICD-10-CM | POA: Insufficient documentation

## 2017-05-25 DIAGNOSIS — Z87891 Personal history of nicotine dependence: Secondary | ICD-10-CM | POA: Insufficient documentation

## 2017-05-25 HISTORY — DX: Gastro-esophageal reflux disease without esophagitis: K21.9

## 2017-05-25 LAB — BASIC METABOLIC PANEL
ANION GAP: 10 (ref 5–15)
BUN: 11 mg/dL (ref 6–20)
CALCIUM: 9.2 mg/dL (ref 8.9–10.3)
CHLORIDE: 101 mmol/L (ref 101–111)
CO2: 26 mmol/L (ref 22–32)
Creatinine, Ser: 1.03 mg/dL (ref 0.61–1.24)
GFR calc non Af Amer: >60 mL/min (ref >60)
GLUCOSE: 106 mg/dL — AB (ref 65–99)
POTASSIUM: 3.5 mmol/L (ref 3.5–5.1)
Sodium: 137 mmol/L (ref 135–145)

## 2017-05-25 LAB — CBC
HCT: 44.1 % (ref 39.0–52.0)
Hemoglobin: 14.9 g/dL (ref 13.0–17.0)
MCH: 31 pg (ref 26.0–34.0)
MCHC: 33.8 g/dL (ref 30.0–36.0)
MCV: 91.7 fL (ref 78.0–100.0)
Platelets: 268 10*3/uL (ref 150–400)
RBC: 4.81 MIL/uL (ref 4.22–5.81)
RDW: 11.7 % (ref 11.5–15.5)
WBC: 7.7 10*3/uL (ref 4.0–10.5)

## 2017-05-25 LAB — TROPONIN I

## 2017-05-25 NOTE — ED Provider Notes (Signed)
[facility]-EMERGENCY DEPT Provider Note   CSN: [id number] Arrival date & time: 05/25/17  1859 By signing my name below, I, Levon Hedger, attest that this documentation has been prepared under the direction and in the presence of Jerelyn Scott, MD . Electronically Signed: Levon Hedger, Scribe. 05/25/2017. 7:39 PM.   History   Chief Complaint Chief Complaint  Patient presents with  . Chest Pain   HPI Gerald Cook is a 26 y.o. male with a history of anxiety, asthma and GERD who presents to the Emergency Department complaining of intermittent chest pain onset tonight at 5:30. Pt states that his chest pain began in his right side, and has since moved to his left side. He reports associated lightheadedness, shortness of breath and tingling sensation to left hand that radiates to his left lateral shoulder. No alleviating or modifying factors noted.  No OTC treatments tried for these symptoms PTA. Pt states his symptoms began with tingling to left fingers while at rest today. Pt states he has experienced tingling to his fingers before, but it has not radiated upwards in the past. His job involves frequent typing. No personal history of PE or DVT. No recent travel. Pt denies any left wrist pain, right arm pain, or fever.  The history is provided by the patient. No language interpreter was used.   Past Medical History:  Diagnosis Date  . Anxiety   . Asthma   . GERD (gastroesophageal reflux disease)     There are no active problems to display for this patient.  Past Surgical History:  Procedure Laterality Date  . TONSILLECTOMY      Home Medications    Prior to Admission medications   Medication Sig Start Date End Date Taking? Authorizing Provider  fluticasone (FLONASE) 50 MCG/ACT nasal spray Place into both nostrils daily.   Yes [provider]  albuterol (PROVENTIL HFA;VENTOLIN HFA) 108 (90 BASE) MCG/ACT inhaler Inhale 1-2 puffs into the lungs every 6 (six) hours as needed for  wheezing or shortness of breath.     [provider]  famotidine (PEPCID AC) 10 MG chewable tablet Chew 1 tablet (10 mg total) by mouth 2 (two) times daily. 04/28/17 05/08/17  Linwood Dibbles, MD  loratadine (CLARITIN) 10 MG tablet Take 10 mg by mouth daily as needed for allergies or itching. Reported on 02/29/2016    [provider]  Multiple Vitamin (MULTIVITAMIN WITH MINERALS) TABS tablet Take 1 tablet by mouth daily. If doesn't take tablet will drink smoothie    [provider]    Family History No family history on file.  Social History Social History  Substance Use Topics  . Smoking status: Former Games developer  . Smokeless tobacco: Never Used  . Alcohol use Yes     Comment: occ    Allergies   Patient has no known allergies.   Review of Systems Review of Systems  Constitutional: Negative for fever.  Respiratory: Positive for shortness of breath.   Cardiovascular: Positive for chest pain.  Musculoskeletal: Negative for myalgias.  Neurological: Positive for light-headedness.       +tingling  All other systems reviewed and are negative.  Physical Exam Updated Vital Signs BP 133/90   Pulse 85   Temp 98.8 F (37.1 C) (Oral)   Resp (!) 0   Ht 6\' 5"  (1.956 m)   Wt (!) 139.7 kg (308 lb)   SpO2 97%   BMI 36.52 kg/m  Vitals reviewed Physical Exam  Physical Examination: General appearance - alert, well appearing,  and in no distress Mental status - alert, oriented to person, place, and time Eyes - no conjunctival injection, no scleral icterus Mouth - mucous membranes moist, pharynx normal without lesions Neck - supple, no significant adenopathy Chest - clear to auscultation, no wheezes, rales or rhonchi, symmetric air entry Heart - normal rate, regular rhythm, normal S1, S2, no murmurs, rubs, clicks or gallops Abdomen - soft, nontender, nondistended, no masses or organomegaly Neurological - alert, oriented, normal speech Extremities - peripheral pulses  normal, no pedal edema, no clubbing or cyanosis Skin - normal coloration and turgor, no rashes   ED Treatments / Results  DIAGNOSTIC STUDIES:  Oxygen Saturation is 100% on RA, normal by my interpretation.    COORDINATION OF CARE:  7:37 PM Discussed treatment plan with pt at bedside and pt agreed to plan.   Labs (all labs ordered are listed, but only abnormal results are displayed) Labs Reviewed  BASIC METABOLIC PANEL - Abnormal; Notable for the following:       Result Value   Glucose, Bld 106 (*)    All other components within normal limits  CBC  TROPONIN I    EKG  EKG Interpretation  Date/Time:  Friday May 25 2017 19:10:14 EDT Ventricular Rate:  100 PR Interval:  160 QRS Duration: 84 QT Interval:  328 QTC Calculation: 423 R Axis:   82 Text Interpretation:  Normal sinus rhythm T wave abnormality, consider inferior ischemia Abnormal ECG No significant change since last tracing Confirmed by Jerelyn ScottLinker, Martha [phone]([id number]) on 05/25/2017 7:12:28 PM       Radiology Dg Chest 2 View  Result Date: 05/25/2017 CLINICAL DATA:  Shortness of breath and right-sided chest pain. EXAM: CHEST  2 VIEW COMPARISON:  04/28/2017 FINDINGS: The lungs are clear without focal pneumonia, edema, pneumothorax or pleural effusion. The cardiopericardial silhouette is within normal limits for size. The visualized bony structures of the thorax are intact. Nodular density/densities projecting over the lungs are compatible with pads for telemetry leads. IMPRESSION: No active cardiopulmonary disease. Electronically Signed   By: Kennith CenterEric  Mansell M.D.   On: 05/25/2017 19:28    Procedures Procedures (including critical care time)  Medications Ordered in ED Medications - No data to display   Initial Impression / Assessment and Plan / ED Course  I have reviewed the triage vital signs and the nursing notes.  Pertinent labs & imaging results that were available during my care of the patient were reviewed by me and  considered in my medical decision making (see chart for details).     Pt presenting with c/o left hand tingling associated with chest pain.  EKG is reassuring,  Troponin is negative.  Perc 0, heart score of 0.  Symptoms are atypical.  Doubt ACS.  Discharged with strict return precautions.  Pt agreeable with plan.  Final Clinical Impressions(s) / ED Diagnoses   Final diagnoses:  Atypical chest pain    New Prescriptions Discharge Medication List as of 05/25/2017  9:18 PM     I personally performed the services described in this documentation, which was scribed in my presence. The recorded information has been reviewed and is accurate.     Jerelyn ScottLinker, Martha, MD 05/27/17 0001

## 2017-05-25 NOTE — ED Triage Notes (Signed)
C/o CP, tingling to left arm-sx started today-NAD-steady gait

## 2017-05-25 NOTE — ED Notes (Signed)
ED Provider at bedside. 

## 2017-05-25 NOTE — Discharge Instructions (Signed)
Return to the ED with any concerns including difficulty breathing, worsening chest pain, fainting, decreased level of alertness/lethargy, or any other alarming symptoms °

## 2019-02-17 IMAGING — CR DG CHEST 2V
2 series · 2 of 2 positions shown · non-contrast
Comparison: 11/30/2014

CLINICAL DATA: 25-year-old male with a history of chest pain

EXAM:
CHEST  2 VIEW

[w chest pa]
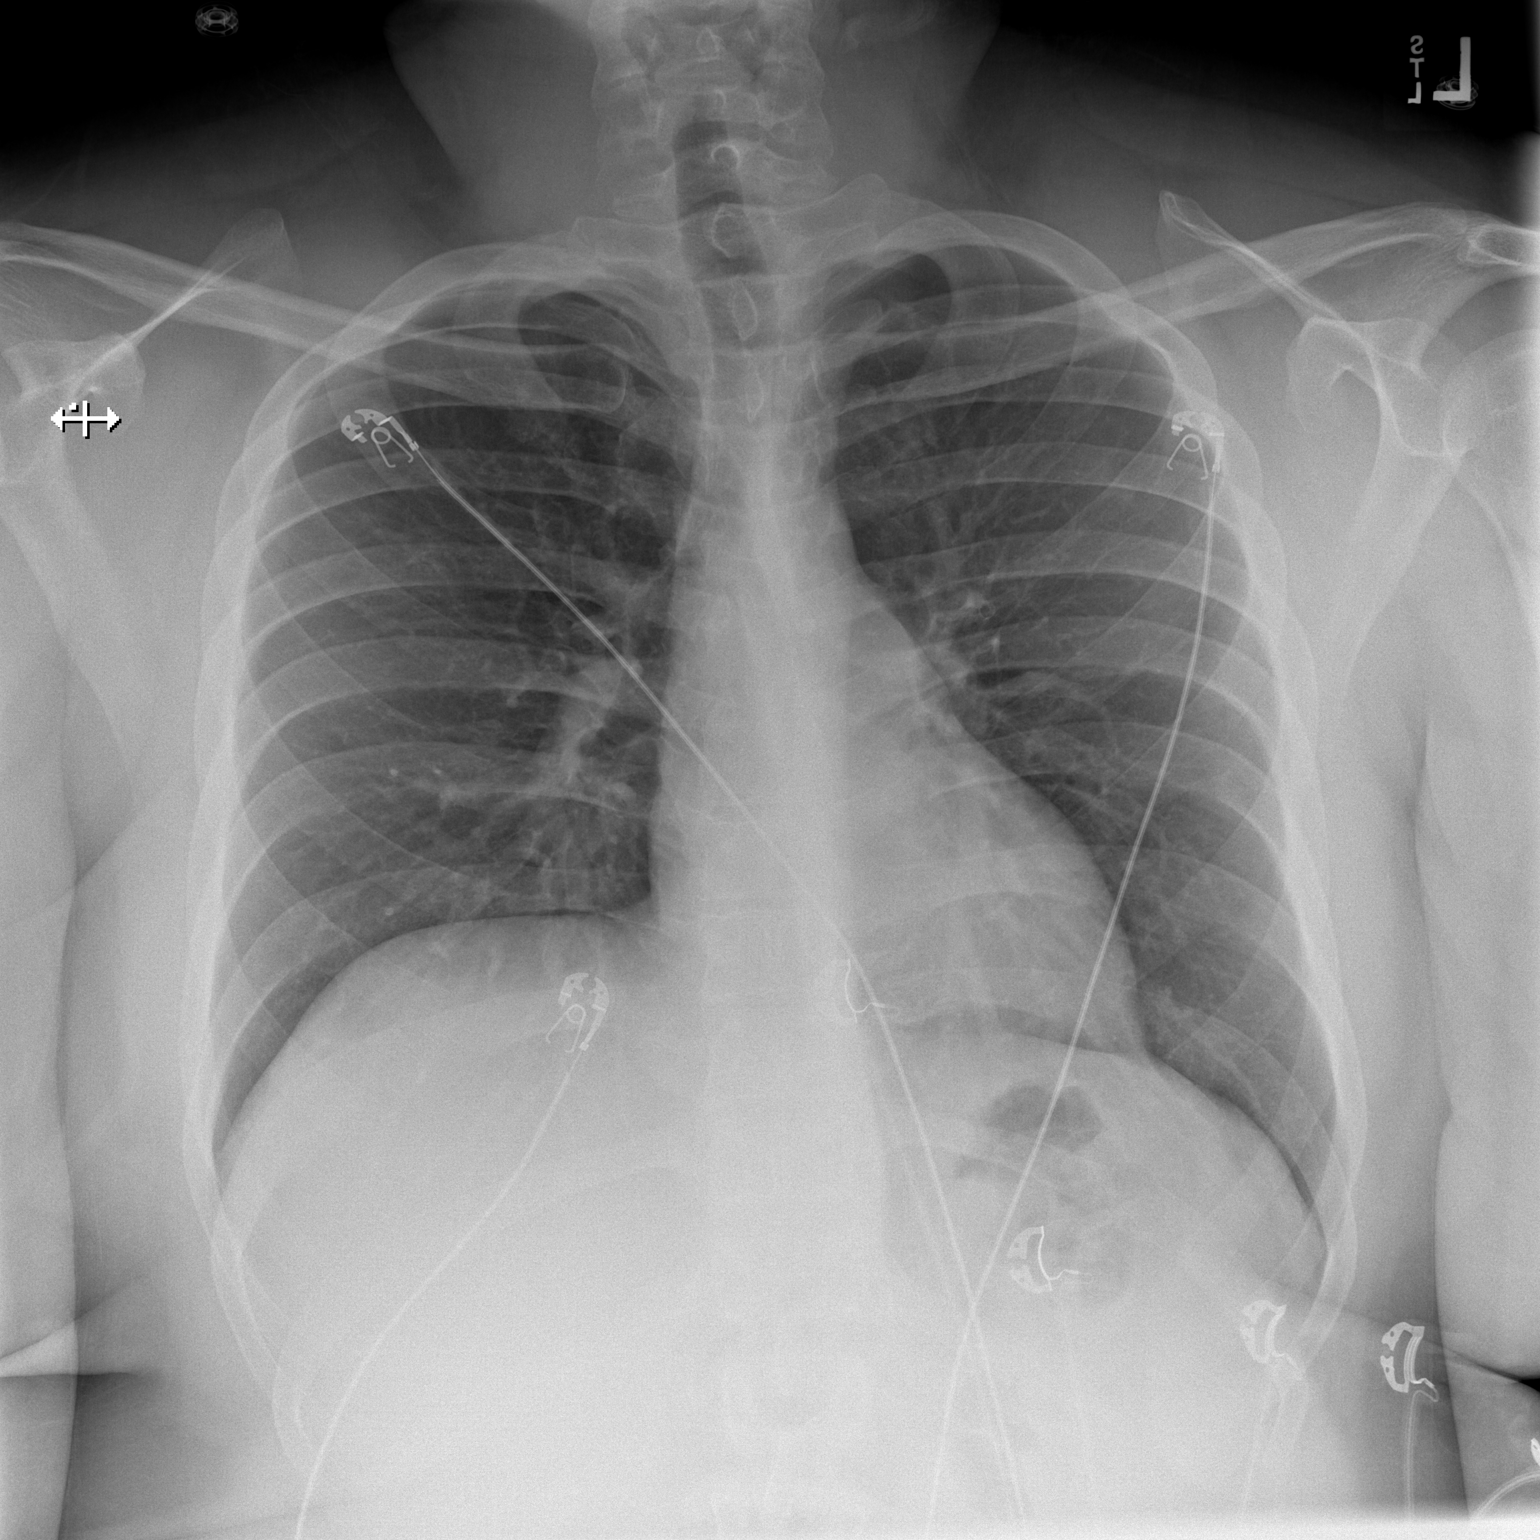

[w chest lat]
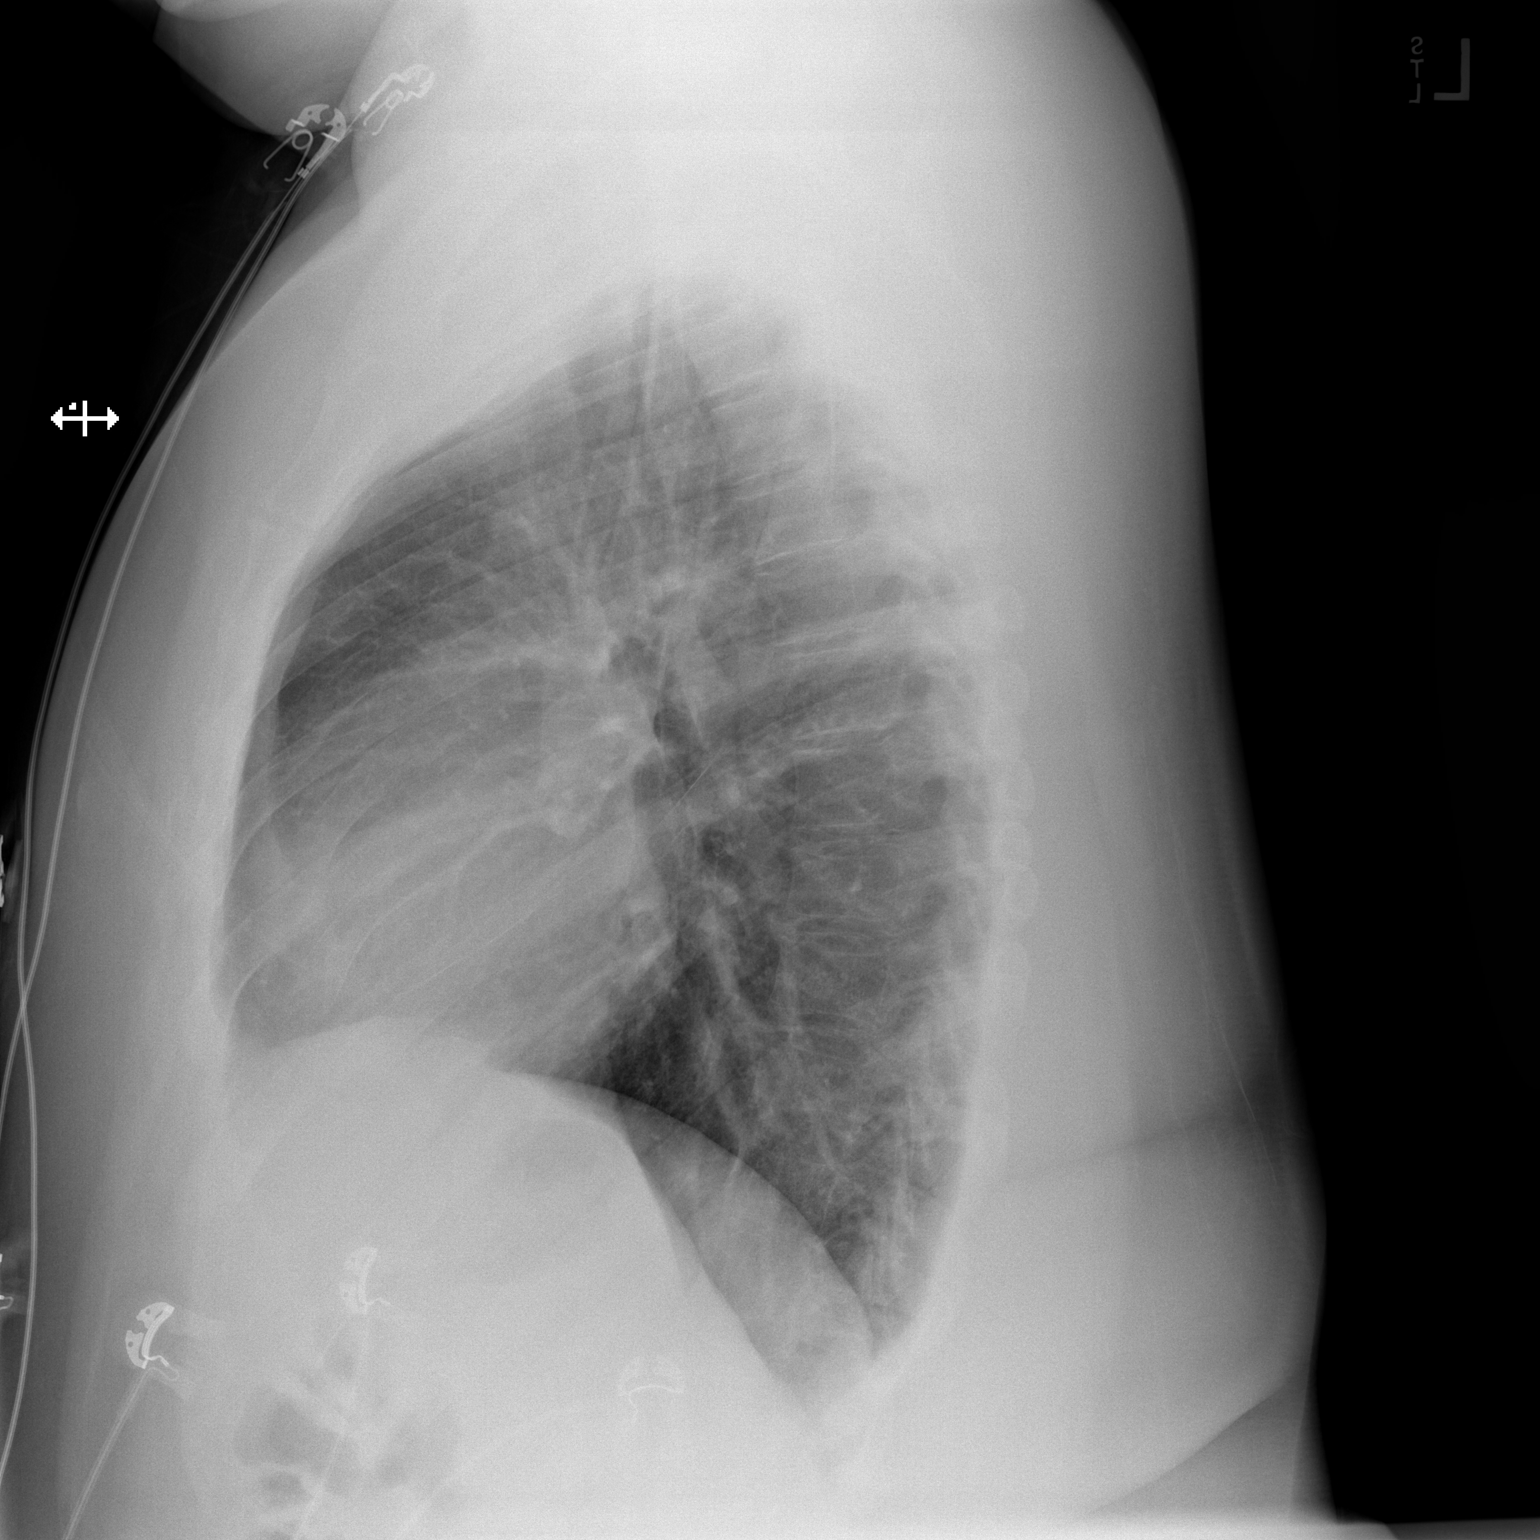

[2 of 2 positions shown; findings below may reference images not displayed]

FINDINGS: Cardiomediastinal silhouette within normal limits.

No pneumothorax, pleural effusion, confluent airspace disease.

No central vascular congestion.

No displaced fracture
IMPRESSION: No radiographic evidence of acute cardiopulmonary disease
